# Patient Record
Sex: Female | Born: 1985 | Race: White | Hispanic: No | Marital: Married | Smoking: Never smoker
Health system: Southern US, Community
[De-identification: ages and names within clinical notes are randomized; demographics above are authoritative.]

## PROBLEM LIST (undated history)

## (undated) DIAGNOSIS — J302 Other seasonal allergic rhinitis: Secondary | ICD-10-CM

## (undated) HISTORY — DX: Other seasonal allergic rhinitis: J30.2

---

## 2004-08-07 ENCOUNTER — Other Ambulatory Visit: Admission: RE | Admit: 2004-08-07 | Discharge: 2004-08-07 | Payer: Self-pay | Admitting: Obstetrics and Gynecology

## 2004-08-08 ENCOUNTER — Other Ambulatory Visit: Admission: RE | Admit: 2004-08-08 | Discharge: 2004-08-08 | Payer: Self-pay | Admitting: Obstetrics and Gynecology

## 2005-01-24 ENCOUNTER — Other Ambulatory Visit: Admission: RE | Admit: 2005-01-24 | Discharge: 2005-01-24 | Payer: Self-pay | Admitting: Obstetrics and Gynecology

## 2005-07-17 ENCOUNTER — Other Ambulatory Visit: Admission: RE | Admit: 2005-07-17 | Discharge: 2005-07-17 | Payer: Self-pay | Admitting: Obstetrics and Gynecology

## 2006-05-18 ENCOUNTER — Emergency Department (HOSPITAL_COMMUNITY): Admission: EM | Admit: 2006-05-18 | Discharge: 2006-05-18 | Payer: Self-pay | Admitting: Emergency Medicine

## 2007-11-10 ENCOUNTER — Encounter: Admission: RE | Admit: 2007-11-10 | Discharge: 2007-11-10 | Payer: Self-pay | Admitting: Obstetrics and Gynecology

## 2013-05-27 NOTE — L&D Delivery Note (Signed)
Delivery Note At 8:26 AM a viable female was delivered via Vaginal, Spontaneous Delivery (Presentation: ; Occiput Anterior).  APGAR: 7, 9; weight . pending  Placenta status: Intact, Spontaneous.  Cord: 3 vessels with the following complications:nuchal cord x 2 None.  Cord pH: na  Anesthesia: Epidural  Episiotomy: none Lacerations: 3rd degree Suture Repair: 2.0 chromic Est. Blood Loss (mL): 350  Mom to postpartum.  Baby to Couplet care / Skin to Skin.  Ramonita Koenig S 01/17/2014, 8:38 AM

## 2013-07-06 ENCOUNTER — Other Ambulatory Visit: Payer: Self-pay

## 2013-07-28 ENCOUNTER — Other Ambulatory Visit (HOSPITAL_COMMUNITY): Payer: Self-pay | Admitting: Obstetrics and Gynecology

## 2013-07-28 DIAGNOSIS — O283 Abnormal ultrasonic finding on antenatal screening of mother: Secondary | ICD-10-CM

## 2013-07-28 DIAGNOSIS — Z3689 Encounter for other specified antenatal screening: Secondary | ICD-10-CM

## 2013-07-30 ENCOUNTER — Encounter (HOSPITAL_COMMUNITY): Payer: Self-pay

## 2013-07-30 ENCOUNTER — Ambulatory Visit (HOSPITAL_COMMUNITY)
Admission: RE | Admit: 2013-07-30 | Discharge: 2013-07-30 | Disposition: A | Discharge status: Home / Self Care | Payer: 59 | Source: Ambulatory Visit | Attending: Obstetrics and Gynecology | Admitting: Obstetrics and Gynecology

## 2013-07-30 DIAGNOSIS — Z363 Encounter for antenatal screening for malformations: Secondary | ICD-10-CM | POA: Insufficient documentation

## 2013-07-30 DIAGNOSIS — O358XX0 Maternal care for other (suspected) fetal abnormality and damage, not applicable or unspecified: Secondary | ICD-10-CM | POA: Insufficient documentation

## 2013-07-30 DIAGNOSIS — O283 Abnormal ultrasonic finding on antenatal screening of mother: Secondary | ICD-10-CM

## 2013-07-30 DIAGNOSIS — Z1389 Encounter for screening for other disorder: Secondary | ICD-10-CM | POA: Insufficient documentation

## 2013-07-30 DIAGNOSIS — Z3689 Encounter for other specified antenatal screening: Secondary | ICD-10-CM

## 2013-07-30 NOTE — Consult Note (Signed)
Maternal Fetal Medicine Consultation  Requesting Provider(s): Harold HedgeJames Tomblin II, MD  Reason for consultation: ? Mass in front of fetal neck  HPI: Penny Knox is a 28 yo G1P0 currently at 3648w0d who was seen for consultation due to possible fetal mass in front of the neck noted on early ultrasound.  Her prenatal course has otherwise been uncomplicated.  Her first trimester screen was low risk for aneuploidy.  She is without complaints today.  OB History: OB History   Grav Para Term Preterm Abortions TAB SAB Ect Mult Living   1 0 0 0 0 0 0 0 0 0       PMH: No past medical history on file.  PSH: No past surgical history on file.  Meds: Prenatal vitamins  Allergies: NKDA   FH: Denies family history of birth defects or hereditary disorders  Soc: denies Tobacco or ETOH use  PE:  161.5#, 122/76, 89  GEN: well-appearing female ABD: gravid, NT  Ultrasound: Single IUP at 16 0/7 weeks The previously noted structure in front of the fetal neck is not appreciated Echogenic bowel was noted that was as echogenic as adjacent bone The remainder of the fetal anatomy is within normal limits Limited views of the fetal heart were obtained due to early gestational age On some views, a small (7 mm x 1 cm) umbilical cord cyst is noted near the placental insertion site  A/P: 1) Single IUP at 16 0/7 weeks         2) Echogenic bowel - we reviewed the differential diagnosis for echogenic bowel to include aneuploidy (had a normal first trimester screen), cystic fibrosis, fetal viral infections (TORCH) as well as swallowed blood and artifact.  The patient previously had Toxoplasmosis serologies performed that were negative for acute infection.  Her CF carrier screen was negative in 2011.  After counseling, the patient decided that she would prefer to wait until her scheduled ultrasound in 3 weeks - if echogenic bowel is again appreciated at that time, she would entertain additional workup then.         3) ?  Small umbilical cord cyst at placental cord insertion site - my suspicion is that this is likely benign.  There are reports of increased risks of both aneuploidy and complications with umbilical cord cysts.  If this finding is again appreciated on follow up, would entertain additional work up at that time.  Recommendations: 1) The patient would prefer to wait until her scheduled ultrasound in her clinic before pursuing a work up.  If echogenic bowel is again noted, would offer NIPS (cell free fetal DNA), viral serologies (CMV and Parvo IgM, IgG).  If the umbilical cord cyst is again noted, this would also be an indication for to check cell free fetal DNA. 2) Please contact our clinic if you would prefer that her follow up ultrasound be performed in this office.   Thank you for the opportunity to be a part of the care of Penny Knox. Please contact our office if we can be of further assistance.   I spent approximately 15 minutes with this patient with over 50% of time spent in face-to-face counseling.  Alpha GulaPaul Dam Ashraf, MD Maternal Fetal Medicine

## 2013-12-15 LAB — OB RESULTS CONSOLE GBS: GBS: NEGATIVE

## 2014-01-17 ENCOUNTER — Inpatient Hospital Stay (HOSPITAL_COMMUNITY)
Admission: AD | Admit: 2014-01-17 | Discharge: 2014-01-18 | DRG: 775 | Disposition: A | Discharge status: Home / Self Care | Payer: 59 | Source: Ambulatory Visit | Attending: Obstetrics and Gynecology | Admitting: Obstetrics and Gynecology

## 2014-01-17 ENCOUNTER — Encounter (HOSPITAL_COMMUNITY): Payer: Self-pay

## 2014-01-17 ENCOUNTER — Encounter (HOSPITAL_COMMUNITY): Payer: 59

## 2014-01-17 ENCOUNTER — Encounter (HOSPITAL_COMMUNITY): Payer: 59 | Admitting: Anesthesiology

## 2014-01-17 ENCOUNTER — Inpatient Hospital Stay (HOSPITAL_COMMUNITY): Payer: 59 | Admitting: Anesthesiology

## 2014-01-17 DIAGNOSIS — O99891 Other specified diseases and conditions complicating pregnancy: Secondary | ICD-10-CM | POA: Diagnosis present

## 2014-01-17 DIAGNOSIS — Z3490 Encounter for supervision of normal pregnancy, unspecified, unspecified trimester: Secondary | ICD-10-CM

## 2014-01-17 LAB — TYPE AND SCREEN
ABO/RH(D): A POS
ANTIBODY SCREEN: NEGATIVE

## 2014-01-17 LAB — CBC
HEMATOCRIT: 39.5 % (ref 36.0–46.0)
Hemoglobin: 13.8 g/dL (ref 12.0–15.0)
MCH: 30.5 pg (ref 26.0–34.0)
MCHC: 34.9 g/dL (ref 30.0–36.0)
MCV: 87.4 fL (ref 78.0–100.0)
Platelets: 240 10*3/uL (ref 150–400)
RBC: 4.52 MIL/uL (ref 3.87–5.11)
RDW: 13.2 % (ref 11.5–15.5)
WBC: 15.1 10*3/uL — AB (ref 4.0–10.5)

## 2014-01-17 LAB — RPR

## 2014-01-17 LAB — POCT FERN TEST: POCT FERN TEST: POSITIVE

## 2014-01-17 LAB — ABO/RH: ABO/RH(D): A POS

## 2014-01-17 MED ORDER — IBUPROFEN 600 MG PO TABS
600.0000 mg | ORAL_TABLET | Freq: Four times a day (QID) | ORAL | Status: DC
  Administered 2014-01-17 – 2014-01-18 (×5): 600 mg via ORAL
  Filled 2014-01-17 (×5): qty 1

## 2014-01-17 MED ORDER — DIPHENHYDRAMINE HCL 50 MG/ML IJ SOLN: 12.5000 mg | INTRAMUSCULAR | Status: DC | PRN

## 2014-01-17 MED ORDER — PHENYLEPHRINE 40 MCG/ML (10ML) SYRINGE FOR IV PUSH (FOR BLOOD PRESSURE SUPPORT)
80.0000 ug | PREFILLED_SYRINGE | INTRAVENOUS | Status: DC | PRN
  Filled 2014-01-17: qty 2

## 2014-01-17 MED ORDER — SIMETHICONE 80 MG PO CHEW: 80.0000 mg | CHEWABLE_TABLET | ORAL | Status: DC | PRN

## 2014-01-17 MED ORDER — TETANUS-DIPHTH-ACELL PERTUSSIS 5-2.5-18.5 LF-MCG/0.5 IM SUSP: 0.5000 mL | Freq: Once | INTRAMUSCULAR | Status: DC

## 2014-01-17 MED ORDER — IBUPROFEN 600 MG PO TABS: 600.0000 mg | ORAL_TABLET | Freq: Four times a day (QID) | ORAL | Status: DC | PRN

## 2014-01-17 MED ORDER — DIPHENHYDRAMINE HCL 25 MG PO CAPS: 25.0000 mg | ORAL_CAPSULE | Freq: Four times a day (QID) | ORAL | Status: DC | PRN

## 2014-01-17 MED ORDER — SENNOSIDES-DOCUSATE SODIUM 8.6-50 MG PO TABS
2.0000 | ORAL_TABLET | ORAL | Status: DC
  Administered 2014-01-18: 2 via ORAL
  Filled 2014-01-17: qty 2

## 2014-01-17 MED ORDER — OXYTOCIN BOLUS FROM INFUSION
500.0000 mL | INTRAVENOUS | Status: DC
  Administered 2014-01-17: 500 mL via INTRAVENOUS

## 2014-01-17 MED ORDER — WITCH HAZEL-GLYCERIN EX PADS: 1.0000 "application " | MEDICATED_PAD | CUTANEOUS | Status: DC | PRN

## 2014-01-17 MED ORDER — LACTATED RINGERS IV SOLN: 500.0000 mL | INTRAVENOUS | Status: DC | PRN

## 2014-01-17 MED ORDER — BENZOCAINE-MENTHOL 20-0.5 % EX AERO
1.0000 "application " | INHALATION_SPRAY | CUTANEOUS | Status: DC | PRN
  Filled 2014-01-17 (×2): qty 56

## 2014-01-17 MED ORDER — FLEET ENEMA 7-19 GM/118ML RE ENEM: 1.0000 | ENEMA | Freq: Every day | RECTAL | Status: DC | PRN

## 2014-01-17 MED ORDER — ACETAMINOPHEN 325 MG PO TABS: 650.0000 mg | ORAL_TABLET | ORAL | Status: DC | PRN

## 2014-01-17 MED ORDER — LACTATED RINGERS IV SOLN
500.0000 mL | Freq: Once | INTRAVENOUS | Status: AC
  Administered 2014-01-17: 500 mL via INTRAVENOUS

## 2014-01-17 MED ORDER — OXYTOCIN 40 UNITS IN LACTATED RINGERS INFUSION - SIMPLE MED
62.5000 mL/h | INTRAVENOUS | Status: DC
  Filled 2014-01-17: qty 1000

## 2014-01-17 MED ORDER — OXYCODONE-ACETAMINOPHEN 5-325 MG PO TABS: 1.0000 | ORAL_TABLET | ORAL | Status: DC | PRN

## 2014-01-17 MED ORDER — PRENATAL MULTIVITAMIN CH
1.0000 | ORAL_TABLET | Freq: Every day | ORAL | Status: DC
  Administered 2014-01-17 – 2014-01-18 (×2): 1 via ORAL
  Filled 2014-01-17 (×2): qty 1

## 2014-01-17 MED ORDER — LIDOCAINE HCL (PF) 1 % IJ SOLN
30.0000 mL | INTRAMUSCULAR | Status: DC | PRN
  Filled 2014-01-17: qty 30

## 2014-01-17 MED ORDER — ONDANSETRON HCL 4 MG PO TABS: 4.0000 mg | ORAL_TABLET | ORAL | Status: DC | PRN

## 2014-01-17 MED ORDER — FENTANYL 2.5 MCG/ML BUPIVACAINE 1/10 % EPIDURAL INFUSION (WH - ANES)
14.0000 mL/h | INTRAMUSCULAR | Status: DC | PRN
  Administered 2014-01-17: 14 mL/h via EPIDURAL
  Filled 2014-01-17: qty 125

## 2014-01-17 MED ORDER — CITRIC ACID-SODIUM CITRATE 334-500 MG/5ML PO SOLN: 30.0000 mL | ORAL | Status: DC | PRN

## 2014-01-17 MED ORDER — LANOLIN HYDROUS EX OINT: TOPICAL_OINTMENT | CUTANEOUS | Status: DC | PRN

## 2014-01-17 MED ORDER — LACTATED RINGERS IV SOLN
INTRAVENOUS | Status: DC
  Administered 2014-01-17: 04:00:00 via INTRAVENOUS
  Administered 2014-01-17: 125 mL/h via INTRAVENOUS

## 2014-01-17 MED ORDER — PHENYLEPHRINE 40 MCG/ML (10ML) SYRINGE FOR IV PUSH (FOR BLOOD PRESSURE SUPPORT)
80.0000 ug | PREFILLED_SYRINGE | INTRAVENOUS | Status: DC | PRN
  Filled 2014-01-17: qty 10
  Filled 2014-01-17: qty 2

## 2014-01-17 MED ORDER — EPHEDRINE 5 MG/ML INJ
10.0000 mg | INTRAVENOUS | Status: DC | PRN
  Filled 2014-01-17: qty 2

## 2014-01-17 MED ORDER — LIDOCAINE HCL (PF) 1 % IJ SOLN
INTRAMUSCULAR | Status: DC | PRN
  Administered 2014-01-17 (×2): 8 mL

## 2014-01-17 MED ORDER — ONDANSETRON HCL 4 MG/2ML IJ SOLN: 4.0000 mg | Freq: Four times a day (QID) | INTRAMUSCULAR | Status: DC | PRN

## 2014-01-17 MED ORDER — FLEET ENEMA 7-19 GM/118ML RE ENEM: 1.0000 | ENEMA | RECTAL | Status: DC | PRN

## 2014-01-17 MED ORDER — FENTANYL 2.5 MCG/ML BUPIVACAINE 1/10 % EPIDURAL INFUSION (WH - ANES)
INTRAMUSCULAR | Status: DC | PRN
  Administered 2014-01-17: 14 mL/h via EPIDURAL

## 2014-01-17 MED ORDER — BISACODYL 10 MG RE SUPP: 10.0000 mg | Freq: Every day | RECTAL | Status: DC | PRN

## 2014-01-17 MED ORDER — DIBUCAINE 1 % RE OINT: 1.0000 "application " | TOPICAL_OINTMENT | RECTAL | Status: DC | PRN

## 2014-01-17 MED ORDER — ONDANSETRON HCL 4 MG/2ML IJ SOLN: 4.0000 mg | INTRAMUSCULAR | Status: DC | PRN

## 2014-01-17 MED ORDER — ZOLPIDEM TARTRATE 5 MG PO TABS: 5.0000 mg | ORAL_TABLET | Freq: Every evening | ORAL | Status: DC | PRN

## 2014-01-17 MED ORDER — BUTORPHANOL TARTRATE 1 MG/ML IJ SOLN
1.0000 mg | INTRAMUSCULAR | Status: DC | PRN
  Administered 2014-01-17: 1 mg via INTRAVENOUS
  Filled 2014-01-17: qty 1

## 2014-01-17 NOTE — MAU Note (Signed)
Leaking clear fluid since 1105 pm tonight. Contractions every 5 minutes. Cervix was closed on Thursday. Denies vaginal bleeding. Positive fetal movement.

## 2014-01-17 NOTE — H&P (Signed)
Penny Knox is a 28 y.o. female presenting at 41.3 with SROM.  PNC complicated by echogenic bowel and cyst of umbilical cord.  Genetic studies neg.  Neg for CMV or Parvovirus.   Maternal Medical History:  Reason for admission: Rupture of membranes.   Contractions: Onset was 1-2 hours ago.   Frequency: regular.   Perceived severity is moderate.    Fetal activity: Perceived fetal activity is normal.    Prenatal complications: Echogenic bowel resolved on f/u   No evidence of recent cmv or parvovirus.   Cyst of umbilical cord near placenta.  genrtics negatve  Prenatal Complications - Diabetes: none.    OB History   Grav Para Term Preterm Abortions TAB SAB Ect Mult Living       History reviewed. No pertinent past medical history. History reviewed. No pertinent past surgical history. Family History: family history is not on file. Social History:  reports that she has never smoked. She has never used smokeless tobacco. She reports that she drinks alcohol. She reports that she does not use illicit drugs.   Prenatal Transfer Tool  Maternal Diabetes: No Genetic Screening: Normal Maternal Ultrasounds/Referrals: Abnormal:  Findings:   Other:echogenic bowel and cyst of umbilical cord Fetal Ultrasounds or other Referrals:  None Maternal Substance Abuse:  No Significant Maternal Medications:  None Significant Maternal Lab Results:  Lab values include: Group B Strep negative Other Comments:  None  ROS  Dilation: 10 Effacement (%): 100 Station: +2 Exam by:: Dr Reola Mosher Blood pressure 124/67, pulse 69, temperature 98.1 F (36.7 C), temperature source Oral, resp. rate 20, height  (1.727 m), weight 82.555 kg (182 lb), last menstrual period 04/09/2013, SpO2 100.00%. Maternal Exam:  Uterine Assessment: Contraction strength is moderate.  Contraction frequency is regular.   Abdomen: Patient reports no abdominal tenderness. Fundal height is c/w dates.   Estimated fetal  weight is 8 lbs.   Fetal presentation: vertex  Introitus: Amniotic fluid character: clear.  Pelvis: adequate for delivery.   Cervix: Complete dilation vtx at 0  Fetal Exam Fetal State Assessment: Category I - tracings are normal.     Physical Exam  Prenatal labs: ABO, Rh: --/--/A POS, A POS (08/24 0345) Antibody: NEG (08/24 0345) Rubella:   RPR:    HBsAg:    HIV:    GBS: Negative (07/22 0000)   Assessment/Plan: IUP at term with Srom Previous echogenic bowel Umbilical cord cyst Plan vag del  Royalty Fakhouri S 01/17/2014, 7:39 AM

## 2014-01-17 NOTE — Anesthesia Postprocedure Evaluation (Signed)
  Anesthesia Post-op Note  Patient: Penny Knox  Procedure(s) Performed: * No procedures listed *  Patient Location: Mother/Baby  Anesthesia Type:Epidural  Level of Consciousness: awake, alert , oriented and patient cooperative  Airway and Oxygen Therapy: Patient Spontanous Breathing  Post-op Pain: mild  Post-op Assessment: Patient's Cardiovascular Status Stable, Respiratory Function Stable, No headache, No backache, No residual numbness and No residual motor weakness  Post-op Vital Signs: stable  Last Vitals:  Filed Vitals:   01/17/14 1208  BP: 143/81  Pulse: 66  Temp: 36.4 C  Resp: 18    Complications: No apparent anesthesia complications

## 2014-01-17 NOTE — Anesthesia Procedure Notes (Signed)
Epidural Patient location during procedure: OB Start time: 01/17/2014 6:19 AM End time: 01/17/2014 6:23 AM  Staffing Anesthesiologist: Leilani Able Performed by: anesthesiologist   Preanesthetic Checklist Completed: patient identified, surgical consent, pre-op evaluation, timeout performed, IV checked, risks and benefits discussed and monitors and equipment checked  Epidural Patient position: sitting Prep: site prepped and draped and DuraPrep Patient monitoring: continuous pulse ox and blood pressure Approach: midline Location: L3-L4 Injection technique: LOR air  Needle:  Needle type: Tuohy  Needle gauge: 17 G Needle length: 9 cm and 9 Needle insertion depth: 5 cm cm Catheter type: closed end flexible Catheter size: 19 Gauge Catheter at skin depth: 10 cm Test dose: negative and Other  Assessment Sensory level: T9 Events: blood not aspirated, injection not painful, no injection resistance, negative IV test and no paresthesia  Additional Notes Reason for block:procedure for pain

## 2014-01-17 NOTE — Lactation Note (Signed)
This note was copied from the chart of Penny Knox. Lactation Consultation Note  Patient Name: Penny Janeka Libman ZOXWR'U Date: 01/17/2014 Reason for consult: Initial assessment of this mom and baby at 13 hours postpartum.  Mom is almost asleep but responds to [facility] and states that her newborn has been latching well consistently since birth.  Baby has had 5 feedings and 2 stools thus far and mom denies any breastfeeding concerns.  LC limited visit due to FOB asleep and baby also asleep in open crib on his back, not showing any feeding cues.  LC encouraged cue feedings and frequent STS.  LC encouraged review of Baby and Me pp 9, 14 and 20-25 for STS and BF information. LC provided [employer] brochure and reviewed [facility] services and list of community and web site resources.    Maternal Data Formula Feeding for Exclusion: No Has patient been taught Hand Expression?:  (not yet documented and mom almost asleep at time of LC visit) Does the patient have breastfeeding experience prior to this delivery?: No  Feeding Feeding Type: Breast Fed  LATCH Score/Interventions           no LATCH score yet assessed but baby has had 5 feedings of 10-30 minutes each           Lactation Tools Discussed/Used   STS and cue feedings  Consult Status Consult Status: Follow-up Date: 01/18/14 Follow-up type: In-patient    Warrick Parisian [facility] 01/17/2014, 10:04 PM

## 2014-01-17 NOTE — Anesthesia Preprocedure Evaluation (Signed)

## 2014-01-18 LAB — COMPREHENSIVE METABOLIC PANEL WITH GFR
ALT: 23 U/L (ref 0–35)
AST: 32 U/L (ref 0–37)
Albumin: 2.3 g/dL — ABNORMAL LOW (ref 3.5–5.2)
Alkaline Phosphatase: 184 U/L — ABNORMAL HIGH (ref 39–117)
Anion gap: 9 (ref 5–15)
BUN: 12 mg/dL (ref 6–23)
CO2: 25 meq/L (ref 19–32)
Calcium: 7.9 mg/dL — ABNORMAL LOW (ref 8.4–10.5)
Chloride: 105 meq/L (ref 96–112)
Creatinine, Ser: 1.06 mg/dL (ref 0.50–1.10)
GFR calc Af Amer: 81 mL/min — ABNORMAL LOW (ref >90)
GFR calc non Af Amer: 70 mL/min — ABNORMAL LOW (ref >90)
Glucose, Bld: 66 mg/dL — ABNORMAL LOW (ref 70–99)
Potassium: 4.4 meq/L (ref 3.7–5.3)
Sodium: 139 meq/L (ref 137–147)
Total Bilirubin: <0.2 mg/dL — ABNORMAL LOW (ref 0.3–1.2)
Total Protein: 4.8 g/dL — ABNORMAL LOW (ref 6.0–8.3)

## 2014-01-18 LAB — CBC
HCT: 38.3 % (ref 36.0–46.0)
Hemoglobin: 13 g/dL (ref 12.0–15.0)
MCH: 29.9 pg (ref 26.0–34.0)
MCHC: 33.9 g/dL (ref 30.0–36.0)
MCV: 88 fL (ref 78.0–100.0)
PLATELETS: 209 10*3/uL (ref 150–400)
RBC: 4.35 MIL/uL (ref 3.87–5.11)
RDW: 13.4 % (ref 11.5–15.5)
WBC: 16.6 10*3/uL — ABNORMAL HIGH (ref 4.0–10.5)

## 2014-01-18 MED ORDER — IBUPROFEN 600 MG PO TABS: 600.0000 mg | ORAL_TABLET | Freq: Four times a day (QID) | ORAL | Status: DC

## 2014-01-18 MED ORDER — PRENATAL MULTIVITAMIN CH: 1.0000 | ORAL_TABLET | Freq: Every day | ORAL | Status: DC

## 2014-01-18 NOTE — Lactation Note (Signed)
This note was copied from the chart of Penny Knox. Lactation Consultation Note  Mother able to hand express good drops of colostrum. Baby had a tight frenulum at tip of tongue.  Recommend family discuss it with Peds. Babies stools transitioning. Mother placed baby in cross cradle hold, sucks and swallows observed. Reviewed engorgement care and monitoring voids/stools. Will see Peds at [facility] tomorrow. Provided mother with comfort gels and reviewed use.  Patient Name: Penny Knox ZOXWR'U Date: 01/18/2014 Reason for consult: Follow-up assessment   Maternal Data    Feeding Feeding Type: Breast Fed  LATCH Score/Interventions Latch: Grasps breast easily, tongue down, lips flanged, rhythmical sucking.  Audible Swallowing: A few with stimulation  Type of Nipple: Everted at rest and after stimulation  Comfort (Breast/Nipple): Soft / non-tender     Hold (Positioning): No assistance needed to correctly position infant at breast. Intervention(s): Breastfeeding basics reviewed  LATCH Score: 9  Lactation Tools Discussed/Used     Consult Status Consult Status: Follow-up Date: 01/19/14 Follow-up type: In-patient    Dahlia Byes [facility] 01/18/2014, 1:51 PM

## 2014-01-18 NOTE — Progress Notes (Signed)
Post Partum Day 1 Subjective: no complaints, up ad lib, voiding, tolerating PO, + flatus and desires early discharge . Denies HA, blurred vision  Objective: Blood pressure 128/90, pulse 82, temperature 98.1 F (36.7 C), temperature source Oral, resp. rate 18, height  (1.727 m), weight 182 lb (82.555 kg), last menstrual period 04/09/2013, SpO2 98.00%, unknown if currently breastfeeding.  Physical Exam:  General: alert and cooperative Lochia: appropriate Uterine Fundus: firm Incision: perineum intact DVT Evaluation: No evidence of DVT seen on physical exam. Negative Homan's sign. No cords or calf tenderness. No significant calf/ankle edema. DTR's 2+ , no clonus   Recent Labs  01/17/14 0345 01/18/14 0651  HGB 13.8 13.0  HCT 39.5 38.3    Assessment/Plan: Discharge home and Circumcision prior to discharge CMP ordered. Discussed signs and symptoms of PIH.   LOS: 1 day   Tenae Graziosi G 01/18/2014, 7:52 AM

## 2014-01-18 NOTE — Discharge Summary (Signed)
Obstetric Discharge Summary Reason for Admission: rupture of membranes Prenatal Procedures: ultrasound Intrapartum Procedures: spontaneous vaginal delivery Postpartum Procedures: none Complications-Operative and Postpartum: 2 degree perineal laceration Hemoglobin  Date Value Ref Range Status  01/18/2014 13.0  12.0 - 15.0 g/dL Final     HCT  Date Value Ref Range Status  01/18/2014 38.3  36.0 - 46.0 % Final    Physical Exam:  General: alert and cooperative Lochia: appropriate Uterine Fundus: firm Incision: perineum intact DVT Evaluation: No evidence of DVT seen on physical exam. Negative Homan's sign. No cords or calf tenderness. No significant calf/ankle edema. Calf/Ankle edema is present. DTR's 2+  Discharge Diagnoses: Term Pregnancy-delivered  Discharge Information: Date: 01/18/2014 Activity: pelvic rest Diet: routine Medications: PNV and Ibuprofen Condition: stable Instructions: refer to practice specific booklet Discharge to: home   Newborn Data: Live born female  Birth Weight: 6 lb 13.5 oz (3104 g) APGAR: 7, 9  Home with mother.  Aralyn Nowak G 01/18/2014, 7:55 AM

## 2014-01-20 ENCOUNTER — Inpatient Hospital Stay (HOSPITAL_COMMUNITY): Admission: RE | Admit: 2014-01-20 | Payer: 59 | Source: Ambulatory Visit

## 2014-03-28 ENCOUNTER — Encounter (HOSPITAL_COMMUNITY): Payer: Self-pay

## 2015-02-04 IMAGING — US US OB DETAIL+14 WK
1 series · 12 of 28 positions shown · non-contrast
Comparison: none

[Series 1: us ob detail+14 wk · 0.17mm/px · 12 of 83 slices shown]
[im 4/83]
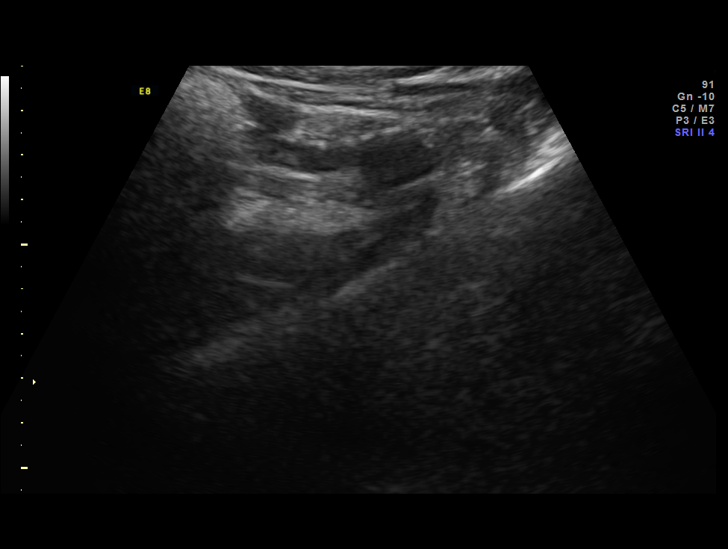
[im 10/83]
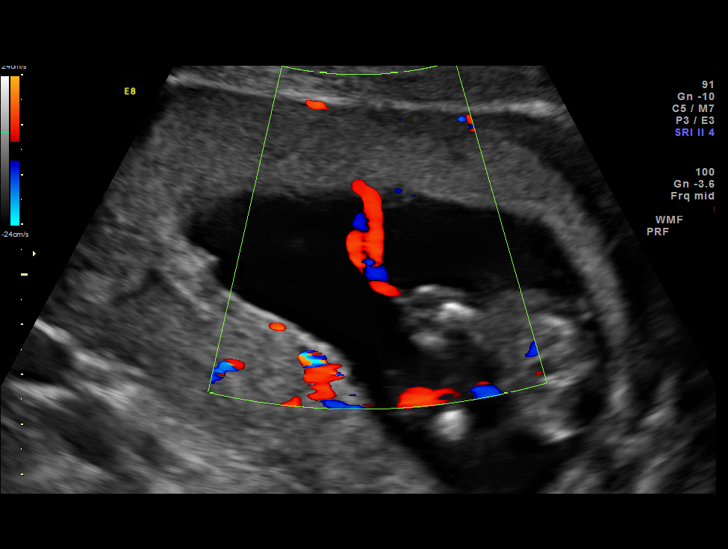
[im 16/83]
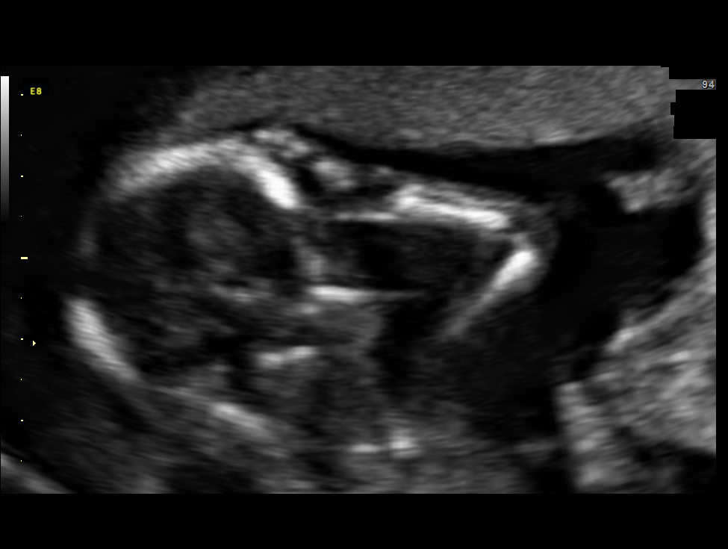
[im 25/83]
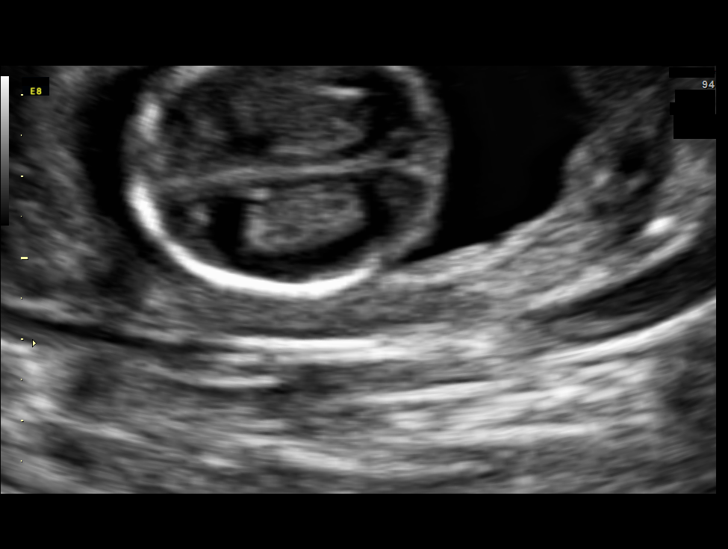
[im 31/83]
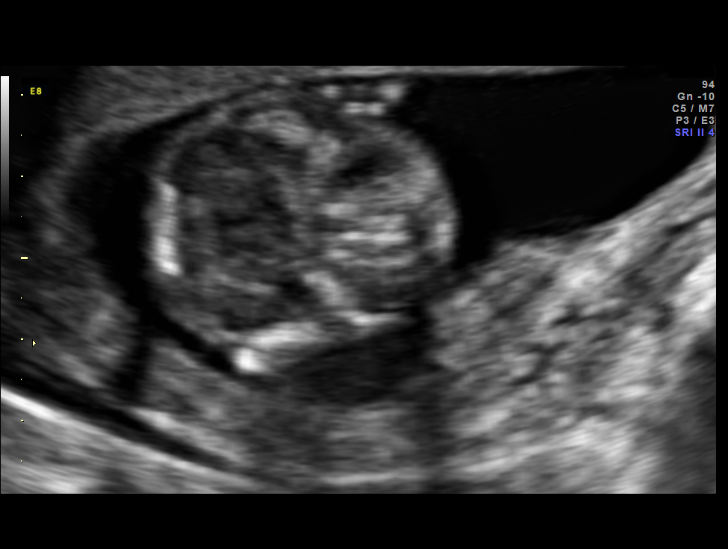
[im 37/83]
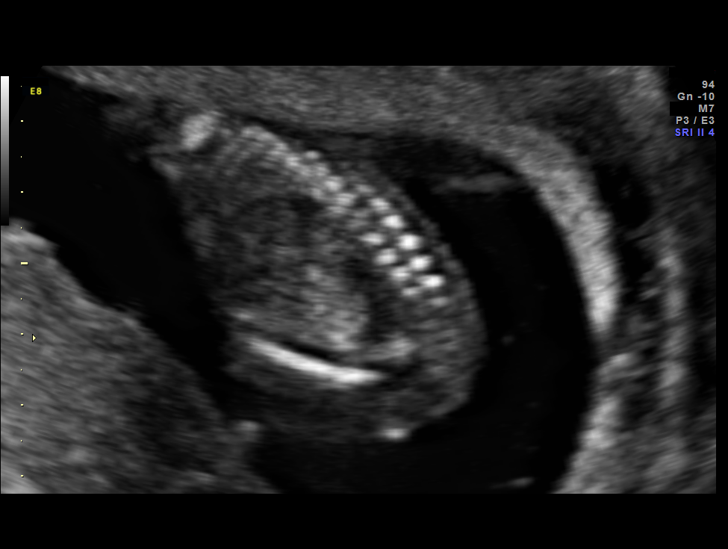
[im 46/83]
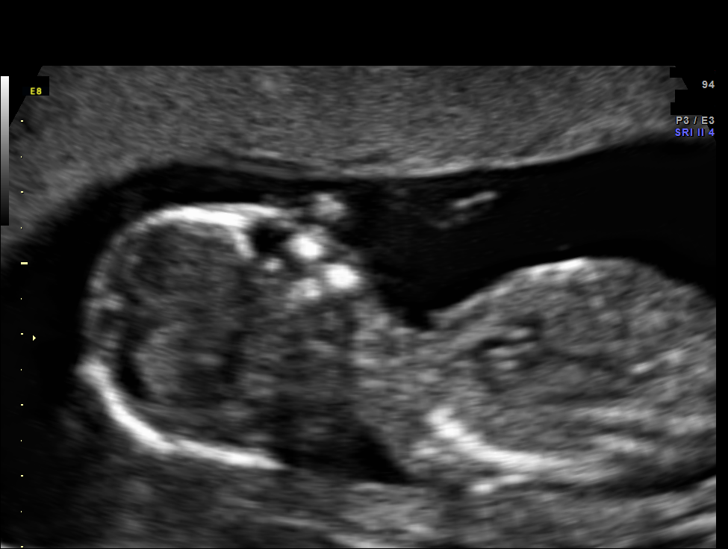
[im 52/83]
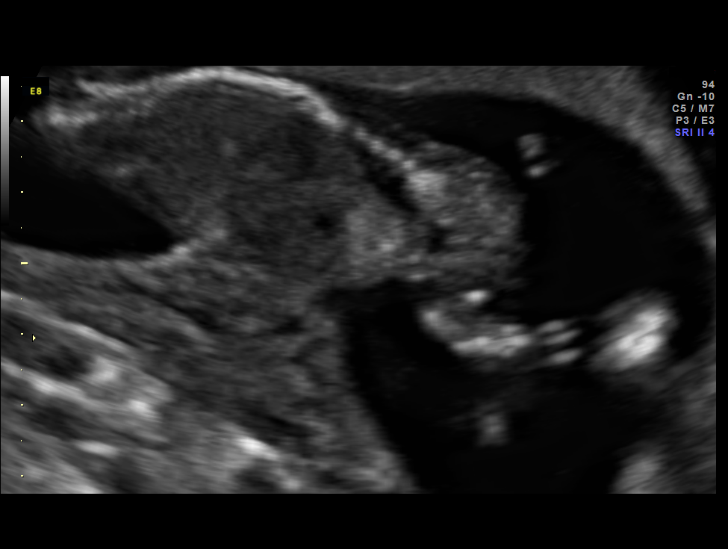
[im 58/83]
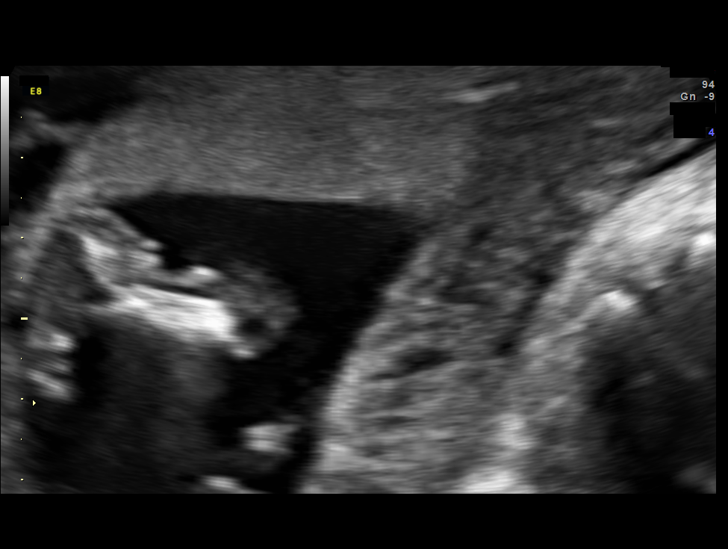
[im 67/83]
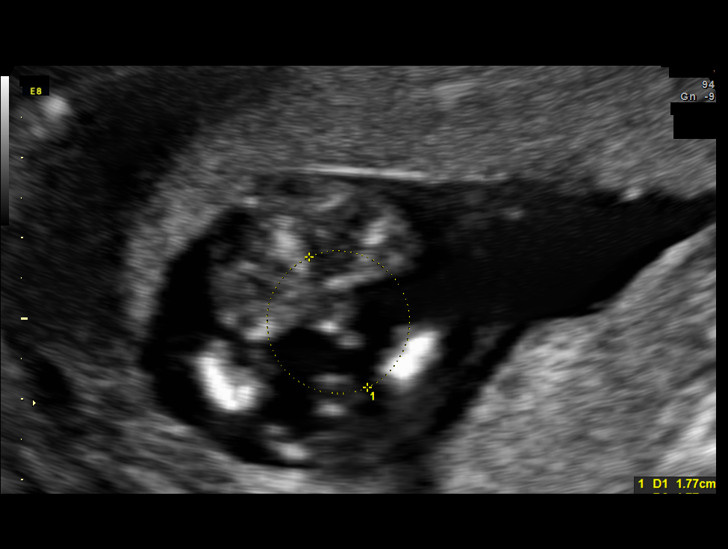
[im 73/83]
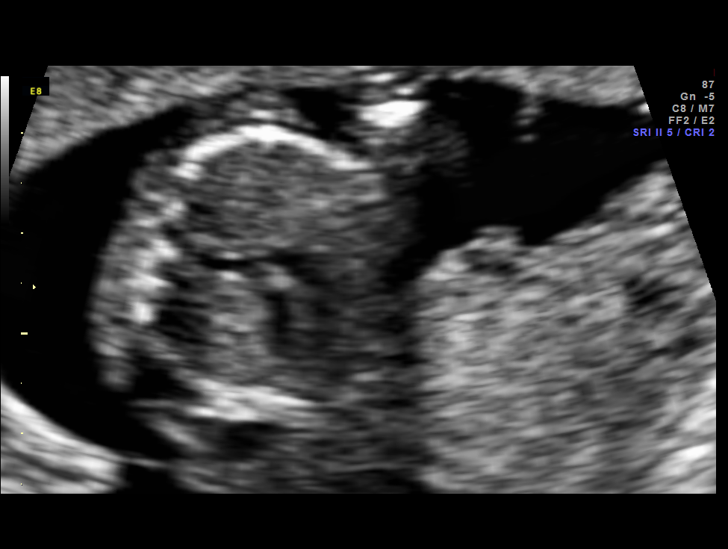
[im 79/83]
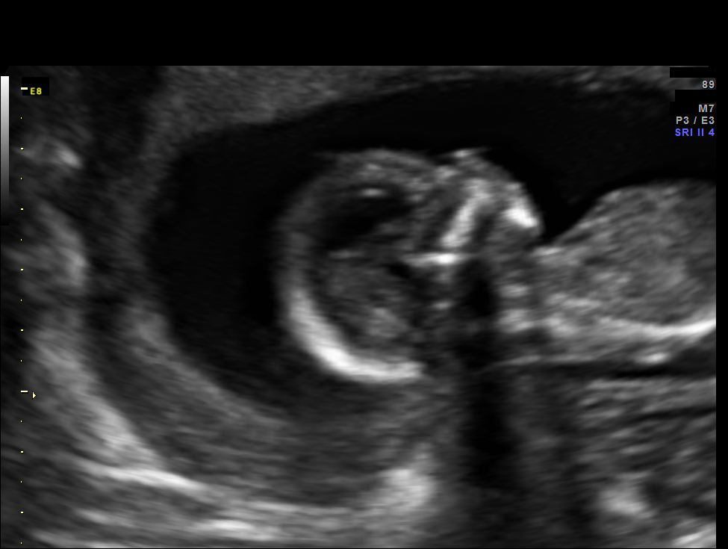

[12 of 28 positions shown; findings below may reference images not displayed]

OBSTETRICS REPORT
                      (Signed Final 07/30/2013 [DATE])

Service(s) Provided

 US OB DETAIL + 14 WK                                  76811.0
Indications

 Detailed fetal anatomic survey
 Fetal abnormality - other known or suspected
Fetal Evaluation

 Num Of Fetuses:    1
 Fetal Heart Rate:  153                          bpm
 Cardiac Activity:  Observed
 Presentation:      Breech
 Placenta:          Anterior, above cervical os
 P. Cord            Visualized
 Insertion:

 Amniotic Fluid
 AFI FV:      Subjectively within normal limits
                                             Larg Pckt:     4.9  cm
Biometry

 BPD:     31.9  mm     G. Age:  16w 0d                CI:         74.4   70 - 86
 OFD:     42.9  mm                                    FL/HC:      15.7   13.3 -

 HC:     120.7  mm     G. Age:  16w 0d       36  %    HC/AC:      1.19   1.05 -

 AC:     101.3  mm     G. Age:  16w 1d       56  %    FL/BPD:
 FL:        19  mm     G. Age:  15w 4d       31  %    FL/AC:      18.8   20 - 24
 HUM:     18.8  mm     G. Age:  15w 4d       37  %
 CER:     15.1  mm     G. Age:  15w 1d       30  %
 NFT:      1.7  mm
 Est. FW:     139  gm      0 lb 5 oz     66  %
Gestational Age

 LMP:           16w 0d        Date:  04/09/13                 EDD:   01/14/14
 U/S Today:     15w 6d                                        EDD:   01/15/14
 Best:          16w 0d     Det. By:  LMP  (04/09/13)          EDD:   01/14/14
Anatomy
 Cranium:          Appears normal         Aortic Arch:      Not well visualized
 Fetal Cavum:      Appears normal         Ductal Arch:      Not well visualized
 Ventricles:       Appears normal         Diaphragm:        Appears normal
 Choroid Plexus:   Appears normal         Stomach:          Appears normal, left
                                                            sided
 Cerebellum:       Appears normal         Abdomen:          Echogenic Bowel
 Posterior Fossa:  Appears normal         Abdominal Wall:   Appears nml (cord
                                                            insert, abd wall)
 Nuchal Fold:      Appears normal         Cord Vessels:     Appears normal (3
                                                            vessel cord)
 Face:             Orbits appear          Kidneys:          Appear normal
                   normal
 Lips:             Appears normal         Bladder:          Appears normal
 Heart:            Not well visualized    Spine:            Appears normal
 RVOT:             Not well visualized    Lower             Appears normal
                                          Extremities:
 LVOT:             Not well visualized    Upper             Appears normal
                                          Extremities:

 Other:  Fetus appears to be a male. Heels and 5th digit appear normal.
Targeted Anatomy

 Fetal Central Nervous System
 Cisterna Magna:
Cervix Uterus Adnexa

 Cervix:       Normal appearance by transabdominal scan. Appears
               closed, without funnelling.
 Left Ovary:    Not visualized. No adnexal mass visualized.
 Right Ovary:   Within normal limits.
Impression

 Single IUP at 16 0/7 weeks
 The previously noted structure in front of the fetal neck is not
 appreciated
 Echogenic bowel was noted that was as echogenic as
 adjacent bone
 The remainder of the fetal anatomy is within normal limits
 Limited views of the fetal heart were obtained due to early
 gestational age
 On some views, a small (7 mm x 1 cm) umbilical cord cyst is
 noted near the placental insertion site- likely of no clinical
 significance

 The findings and limitations for the study were discussed with
 the patient.  The patient elected to await further work up until
 after her scheduled ultrasound in 3 weeks.
Recommendations

 See separate consult letter.
 If echogenic bowel is persistent, would offer further work up
 to include NIPS (cell free fetal DNA) and viral serologies
 (CMV and Parvo IgG and IgM)
 Please contact our office if you would prefer that her follow up
 ultrasound be scheduled here.
 questions or concerns.

## 2015-04-18 LAB — OB RESULTS CONSOLE RUBELLA ANTIBODY, IGM: Rubella: IMMUNE

## 2015-04-18 LAB — OB RESULTS CONSOLE HIV ANTIBODY (ROUTINE TESTING): HIV: NONREACTIVE

## 2015-04-18 LAB — OB RESULTS CONSOLE GC/CHLAMYDIA
Chlamydia: NEGATIVE
Gonorrhea: NEGATIVE

## 2015-04-18 LAB — OB RESULTS CONSOLE ABO/RH: RH TYPE: POSITIVE

## 2015-04-18 LAB — OB RESULTS CONSOLE ANTIBODY SCREEN: Antibody Screen: NEGATIVE

## 2015-04-18 LAB — OB RESULTS CONSOLE HEPATITIS B SURFACE ANTIGEN: HEP B S AG: NEGATIVE

## 2015-04-18 LAB — OB RESULTS CONSOLE RPR: RPR: NONREACTIVE

## 2015-05-28 NOTE — L&D Delivery Note (Signed)
SVD of VMI at 0230 on 11/23/15.  EBL 150cc.  Placenta to L&D.  APGARs 8,10. Head delivered LOA and body followed atraumatically.  Baby to abdomen.  Cord was clamped and cut.  Cord blood was obtained.  Placenta delivered S/I/3VC.  Fundus was firmed with pitocin and massage.  2nd degree and right perineal lacs were repaired with 3-0 Rapide in the normal fashion. Mom and baby stable.    Mitchel HonourMegan Earline Stiner, DO

## 2015-11-22 ENCOUNTER — Inpatient Hospital Stay (HOSPITAL_COMMUNITY): Payer: 59 | Admitting: Anesthesiology

## 2015-11-22 ENCOUNTER — Encounter (HOSPITAL_COMMUNITY): Payer: Self-pay | Admitting: General Practice

## 2015-11-22 ENCOUNTER — Telehealth (HOSPITAL_COMMUNITY): Payer: Self-pay | Admitting: General Practice

## 2015-11-22 ENCOUNTER — Inpatient Hospital Stay (HOSPITAL_COMMUNITY)
Admission: AD | Admit: 2015-11-22 | Discharge: 2015-11-24 | DRG: 775 | Disposition: A | Discharge status: Home / Self Care | Payer: 59 | Source: Ambulatory Visit | Attending: Obstetrics & Gynecology | Admitting: Obstetrics & Gynecology

## 2015-11-22 ENCOUNTER — Encounter (HOSPITAL_COMMUNITY): Payer: Self-pay

## 2015-11-22 DIAGNOSIS — Z833 Family history of diabetes mellitus: Secondary | ICD-10-CM

## 2015-11-22 DIAGNOSIS — Z8249 Family history of ischemic heart disease and other diseases of the circulatory system: Secondary | ICD-10-CM | POA: Diagnosis not present

## 2015-11-22 DIAGNOSIS — Z3A40 40 weeks gestation of pregnancy: Secondary | ICD-10-CM | POA: Diagnosis not present

## 2015-11-22 DIAGNOSIS — Z818 Family history of other mental and behavioral disorders: Secondary | ICD-10-CM

## 2015-11-22 DIAGNOSIS — Z3490 Encounter for supervision of normal pregnancy, unspecified, unspecified trimester: Secondary | ICD-10-CM

## 2015-11-22 LAB — CBC
HCT: 38.2 % (ref 36.0–46.0)
Hemoglobin: 13.2 g/dL (ref 12.0–15.0)
MCH: 29.3 pg (ref 26.0–34.0)
MCHC: 34.6 g/dL (ref 30.0–36.0)
MCV: 84.9 fL (ref 78.0–100.0)
Platelets: 223 K/uL (ref 150–400)
RBC: 4.5 MIL/uL (ref 3.87–5.11)
RDW: 13.6 % (ref 11.5–15.5)
WBC: 14.9 K/uL — ABNORMAL HIGH (ref 4.0–10.5)

## 2015-11-22 LAB — TYPE AND SCREEN
ABO/RH(D): A POS
Antibody Screen: NEGATIVE

## 2015-11-22 LAB — OB RESULTS CONSOLE GBS: GBS: NEGATIVE

## 2015-11-22 MED ORDER — ONDANSETRON HCL 4 MG/2ML IJ SOLN: 4.0000 mg | Freq: Four times a day (QID) | INTRAMUSCULAR | Status: DC | PRN

## 2015-11-22 MED ORDER — OXYTOCIN 40 UNITS IN LACTATED RINGERS INFUSION - SIMPLE MED
2.5000 [IU]/h | INTRAVENOUS | Status: DC
  Filled 2015-11-22: qty 1000

## 2015-11-22 MED ORDER — OXYTOCIN BOLUS FROM INFUSION
500.0000 mL | INTRAVENOUS | Status: DC
  Administered 2015-11-23: 500 mL via INTRAVENOUS

## 2015-11-22 MED ORDER — FENTANYL 2.5 MCG/ML BUPIVACAINE 1/10 % EPIDURAL INFUSION (WH - ANES)
INTRAMUSCULAR | Status: DC | PRN
  Administered 2015-11-22: 3 mL/h via EPIDURAL

## 2015-11-22 MED ORDER — SOD CITRATE-CITRIC ACID 500-334 MG/5ML PO SOLN: 30.0000 mL | ORAL | Status: DC | PRN

## 2015-11-22 MED ORDER — OXYCODONE-ACETAMINOPHEN 5-325 MG PO TABS: 1.0000 | ORAL_TABLET | ORAL | Status: DC | PRN

## 2015-11-22 MED ORDER — OXYCODONE-ACETAMINOPHEN 5-325 MG PO TABS: 2.0000 | ORAL_TABLET | ORAL | Status: DC | PRN

## 2015-11-22 MED ORDER — BUTORPHANOL TARTRATE 1 MG/ML IJ SOLN: 1.0000 mg | INTRAMUSCULAR | Status: DC | PRN

## 2015-11-22 MED ORDER — LACTATED RINGERS IV SOLN
INTRAVENOUS | Status: DC
  Administered 2015-11-22: 22:00:00 via INTRAVENOUS

## 2015-11-22 MED ORDER — LACTATED RINGERS IV SOLN: 500.0000 mL | INTRAVENOUS | Status: DC | PRN

## 2015-11-22 MED ORDER — FLEET ENEMA 7-19 GM/118ML RE ENEM: 1.0000 | ENEMA | RECTAL | Status: DC | PRN

## 2015-11-22 MED ORDER — PHENYLEPHRINE 40 MCG/ML (10ML) SYRINGE FOR IV PUSH (FOR BLOOD PRESSURE SUPPORT)
PREFILLED_SYRINGE | INTRAVENOUS | Status: AC
  Filled 2015-11-22: qty 20

## 2015-11-22 MED ORDER — ACETAMINOPHEN 325 MG PO TABS: 650.0000 mg | ORAL_TABLET | ORAL | Status: DC | PRN

## 2015-11-22 MED ORDER — FENTANYL 2.5 MCG/ML BUPIVACAINE 1/10 % EPIDURAL INFUSION (WH - ANES)
INTRAMUSCULAR | Status: AC
  Administered 2015-11-22: 14 mL/h
  Filled 2015-11-22: qty 125

## 2015-11-22 MED ORDER — LIDOCAINE HCL (PF) 1 % IJ SOLN
30.0000 mL | INTRAMUSCULAR | Status: DC | PRN
  Filled 2015-11-22: qty 30

## 2015-11-22 NOTE — H&P (Signed)
Penny SealsKylie Knox is a 30 y.o. female presenting for SOL; CTX started at 630pm tonight.  She denies LOF and VB.  Active FM.  Antepartum course uncomplicated.  GBS negative.  Maternal Medical History:  Reason for admission: Contractions.   Contractions: Onset was 3-5 hours ago.   Frequency: regular.   Perceived severity is moderate.    Fetal activity: Perceived fetal activity is normal.   Last perceived fetal movement was within the past hour.    Prenatal complications: no prenatal complications Prenatal Complications - Diabetes: none.    OB History    Gravida Para Term Preterm AB TAB SAB Ectopic Multiple Living   2 1 1  0 0 0 0 0 0 1     History reviewed. No pertinent past medical history. History reviewed. No pertinent past surgical history. Family History: family history includes Cancer in her paternal grandfather; Depression in her brother and paternal grandmother; Diabetes in her maternal uncle; Hypertension in her maternal grandmother. Social History:  reports that she has never smoked. She has never used smokeless tobacco. She reports that she drinks alcohol. She reports that she does not use illicit drugs.   Prenatal Transfer Tool  Maternal Diabetes: No Genetic Screening: Normal Maternal Ultrasounds/Referrals: Normal Fetal Ultrasounds or other Referrals:  None Maternal Substance Abuse:  No Significant Maternal Medications:  None Significant Maternal Lab Results:  Lab values include: Group B Strep negative Other Comments:  None  ROS  Dilation: 5.5 Effacement (%): 50 Station: -3 Blood pressure 125/85, pulse 78, temperature 97.5 F (36.4 C), temperature source Oral, resp. rate 18, height 5\' 8"  (1.727 m), weight 183 lb (83.008 kg), last menstrual period 02/15/2015, unknown if currently breastfeeding. Maternal Exam:  Uterine Assessment: Contraction strength is moderate.  Contraction frequency is regular.   Abdomen: Patient reports no abdominal tenderness. Fundal height is  c/w dates.   Estimated fetal weight is 8#.       Physical Exam  Constitutional: She is oriented to person, place, and time. She appears well-developed and well-nourished.  GI: Soft. There is no rebound and no guarding.  Neurological: She is alert and oriented to person, place, and time.  Skin: Skin is warm and dry.  Psychiatric: She has a normal mood and affect. Her behavior is normal.    Prenatal labs: ABO, Rh: A/Positive/-- (11/22 0000) Antibody: Negative (11/22 0000) Rubella: Immune (11/22 0000) RPR: Nonreactive (11/22 0000)  HBsAg: Negative (11/22 0000)  HIV: Non-reactive (11/22 0000)  GBS: Negative (06/28 0000)   Assessment/Plan: 29yo G2P1001 at 6349w0d with SOL -Epidural -Anticipate NSVD   Keelen Quevedo 11/22/2015, 9:46 PM

## 2015-11-22 NOTE — Anesthesia Preprocedure Evaluation (Signed)

## 2015-11-22 NOTE — Telephone Encounter (Signed)
Preadmission screen  

## 2015-11-22 NOTE — Anesthesia Procedure Notes (Signed)
Epidural Patient location during procedure: OB  Preanesthetic Checklist Completed: patient identified, site marked, surgical consent, pre-op evaluation, timeout performed, IV checked, risks and benefits discussed and monitors and equipment checked  Epidural Patient position: sitting Prep: site prepped and draped and DuraPrep Patient monitoring: continuous pulse ox and blood pressure Approach: midline Location: L3-L4 Injection technique: LOR air  Needle:  Needle type: Tuohy  Needle gauge: 17 G Needle length: 9 cm and 9 Needle insertion depth: 5 cm cm Catheter type: closed end flexible Catheter size: 19 Gauge Catheter at skin depth: 10 cm Test dose: negative  Assessment Events: blood not aspirated, injection not painful, no injection resistance, negative IV test and no paresthesia  Additional Notes Dosed 3cc from pump thru sprotte 25GA through touhy (-) asp on catheter Pump started after taped to back Pt comfortable

## 2015-11-23 ENCOUNTER — Encounter (HOSPITAL_COMMUNITY): Payer: Self-pay | Admitting: *Deleted

## 2015-11-23 LAB — RPR: RPR Ser Ql: NONREACTIVE

## 2015-11-23 MED ORDER — OXYCODONE-ACETAMINOPHEN 5-325 MG PO TABS: 1.0000 | ORAL_TABLET | ORAL | Status: DC | PRN

## 2015-11-23 MED ORDER — IBUPROFEN 600 MG PO TABS
600.0000 mg | ORAL_TABLET | Freq: Four times a day (QID) | ORAL | Status: DC
  Administered 2015-11-23 – 2015-11-24 (×6): 600 mg via ORAL
  Filled 2015-11-23 (×6): qty 1

## 2015-11-23 MED ORDER — OXYCODONE-ACETAMINOPHEN 5-325 MG PO TABS: 2.0000 | ORAL_TABLET | ORAL | Status: DC | PRN

## 2015-11-23 MED ORDER — BENZOCAINE-MENTHOL 20-0.5 % EX AERO
1.0000 "application " | INHALATION_SPRAY | CUTANEOUS | Status: DC | PRN
  Administered 2015-11-23: 1 via TOPICAL
  Filled 2015-11-23 (×3): qty 56

## 2015-11-23 MED ORDER — ZOLPIDEM TARTRATE 5 MG PO TABS: 5.0000 mg | ORAL_TABLET | Freq: Every evening | ORAL | Status: DC | PRN

## 2015-11-23 MED ORDER — COCONUT OIL OIL
1.0000 "application " | TOPICAL_OIL | Status: DC | PRN
  Filled 2015-11-23 (×2): qty 120

## 2015-11-23 MED ORDER — TETANUS-DIPHTH-ACELL PERTUSSIS 5-2.5-18.5 LF-MCG/0.5 IM SUSP: 0.5000 mL | Freq: Once | INTRAMUSCULAR | Status: DC

## 2015-11-23 MED ORDER — ACETAMINOPHEN 325 MG PO TABS: 650.0000 mg | ORAL_TABLET | ORAL | Status: DC | PRN

## 2015-11-23 MED ORDER — ONDANSETRON HCL 4 MG/2ML IJ SOLN: 4.0000 mg | INTRAMUSCULAR | Status: DC | PRN

## 2015-11-23 MED ORDER — DIPHENHYDRAMINE HCL 25 MG PO CAPS: 25.0000 mg | ORAL_CAPSULE | Freq: Four times a day (QID) | ORAL | Status: DC | PRN

## 2015-11-23 MED ORDER — SIMETHICONE 80 MG PO CHEW
80.0000 mg | CHEWABLE_TABLET | ORAL | Status: DC | PRN
Start: 2015-11-23 — End: 2015-11-24

## 2015-11-23 MED ORDER — PRENATAL MULTIVITAMIN CH
1.0000 | ORAL_TABLET | Freq: Every day | ORAL | Status: DC
  Administered 2015-11-23 – 2015-11-24 (×2): 1 via ORAL
  Filled 2015-11-23 (×2): qty 1

## 2015-11-23 MED ORDER — SENNOSIDES-DOCUSATE SODIUM 8.6-50 MG PO TABS
2.0000 | ORAL_TABLET | ORAL | Status: DC
  Administered 2015-11-24: 2 via ORAL
  Filled 2015-11-23: qty 2

## 2015-11-23 MED ORDER — DIBUCAINE 1 % RE OINT
1.0000 "application " | TOPICAL_OINTMENT | RECTAL | Status: DC | PRN
  Filled 2015-11-23: qty 28.4

## 2015-11-23 MED ORDER — WITCH HAZEL-GLYCERIN EX PADS: 1.0000 "application " | MEDICATED_PAD | CUTANEOUS | Status: DC | PRN

## 2015-11-23 MED ORDER — ONDANSETRON HCL 4 MG PO TABS: 4.0000 mg | ORAL_TABLET | ORAL | Status: DC | PRN

## 2015-11-23 NOTE — Lactation Note (Signed)
This note was copied from a baby's chart. Lactation Consultation Note  Patient Name: Boy Humberto SealsKylie Hilgeman ZOXWR'UToday's Date: 11/23/2015 Reason for consult: Initial assessment  Baby 14 hours old. [facility] nursing [facility]. Mom has baby latched in a modified laid back position, and baby is latched deeply with lips flanged and a few swallows noted. Mom reports that baby's last stool is already transitioning to brown. Discussed progression of milk coming to volume and enc nursing with cues and alternating breasts. [facility], aware of OP/BFSG and LC phone line assistance after D/C.  Maternal Data Does the patient have breastfeeding experience prior to this delivery?: Yes  Feeding Feeding Type: Breast Fed Length of feed:  (LC assessed 10 minutes of BF. )  LATCH Score/Interventions Latch: Grasps breast easily, tongue down, lips flanged, rhythmical sucking.  Audible Swallowing: A few with stimulation Intervention(s): Skin to skin  Type of Nipple: Everted at rest and after stimulation  Comfort (Breast/Nipple): Soft / non-tender     Hold (Positioning): No assistance needed to correctly position infant at breast.  LATCH Score: 9  Lactation Tools Discussed/Used     Consult Status Consult Status: Follow-up Date: 11/24/15 Follow-up type: In-patient    Geralynn OchsWILLIARD, Keaundra Stehle 11/23/2015, 5:23 PM

## 2015-11-23 NOTE — Anesthesia Postprocedure Evaluation (Signed)
Anesthesia Post Note  Patient: Humberto SealsKylie Holt  Procedure(s) Performed: * No procedures listed *  Patient location during evaluation: Mother Baby Anesthesia Type: Epidural Level of consciousness: awake, awake and alert, oriented and patient cooperative Pain management: pain level controlled Vital Signs Assessment: post-procedure vital signs reviewed and stable Respiratory status: spontaneous breathing, nonlabored ventilation and respiratory function stable Cardiovascular status: stable Postop Assessment: no headache, no backache, patient able to bend at knees and no signs of nausea or vomiting Anesthetic complications: no     Last Vitals:  Filed Vitals:   11/23/15 0830 11/23/15 1330  BP: 110/74 116/74  Pulse: 71 65  Temp: 36.6 C 36.6 C  Resp: 16 14    Last Pain:  Filed Vitals:   11/23/15 1333  PainSc: 2    Pain Goal: Patients Stated Pain Goal: 2 (11/22/15 2258)               Maleni Seyer L

## 2015-11-23 NOTE — Progress Notes (Signed)
Post Partum Day 0 Subjective: no complaints, up ad lib, voiding and tolerating PO  Objective: Blood pressure 111/72, pulse 78, temperature 97.9 F (36.6 C), temperature source Oral, resp. rate 14, height 5\' 8"  (1.727 m), weight 183 lb (83.008 kg), last menstrual period 02/15/2015, SpO2 100 %, unknown if currently breastfeeding.  Physical Exam:  General: alert and cooperative Lochia: appropriate Uterine Fundus: firm Incision: healing well DVT Evaluation: No evidence of DVT seen on physical exam. Negative Homan's sign. No cords or calf tenderness. No significant calf/ankle edema.   Recent Labs  11/22/15 2205  HGB 13.2  HCT 38.2    Assessment/Plan: Plan for discharge tomorrow and Circumcision prior to discharge   LOS: 1 day   CURTIS,CAROL G 11/23/2015, 8:54 AM

## 2015-11-24 LAB — CBC
HEMATOCRIT: 36 % (ref 36.0–46.0)
HEMOGLOBIN: 12.2 g/dL (ref 12.0–15.0)
MCH: 29.3 pg (ref 26.0–34.0)
MCHC: 33.9 g/dL (ref 30.0–36.0)
MCV: 86.3 fL (ref 78.0–100.0)
PLATELETS: 210 10*3/uL (ref 150–400)
RBC: 4.17 MIL/uL (ref 3.87–5.11)
RDW: 14.1 % (ref 11.5–15.5)
WBC: 10.2 10*3/uL (ref 4.0–10.5)

## 2015-11-24 MED ORDER — IBUPROFEN 600 MG PO TABS: 600.0000 mg | ORAL_TABLET | Freq: Four times a day (QID) | ORAL | Status: DC

## 2015-11-24 NOTE — Discharge Summary (Signed)
Obstetric Discharge Summary Reason for Admission: onset of labor Prenatal Procedures: ultrasound Intrapartum Procedures: spontaneous vaginal delivery Postpartum Procedures: none Complications-Operative and Postpartum: 2 degree perineal laceration HEMOGLOBIN  Date Value Ref Range Status  11/24/2015 12.2 12.0 - 15.0 g/dL Final   HCT  Date Value Ref Range Status  11/24/2015 36.0 36.0 - 46.0 % Final    Physical Exam:  General: alert and cooperative Lochia: appropriate Uterine Fundus: firm Incision: healing well DVT Evaluation: No evidence of DVT seen on physical exam. Negative Homan's sign. No cords or calf tenderness. No significant calf/ankle edema.  Discharge Diagnoses: Term Pregnancy-delivered  Discharge Information: Date: 11/24/2015 Activity: pelvic rest Diet: routine Medications: PNV and Ibuprofen Condition: stable Instructions: refer to practice specific booklet Discharge to: home   Newborn Data: Live born female  Birth Weight: 8 lb 15.4 oz (4065 g) APGAR: 8, 10  Home with mother.  Erina Hamme G 11/24/2015, 8:50 AM

## 2015-11-24 NOTE — Lactation Note (Signed)
This note was copied from a baby's chart. Lactation Consultation Note  Follow up visit made prior to discharge.  Mom states newborn is breastfeeding well and no concerns at present.  Reviewed using good breast massage during feeding to keep baby engaged and increase flow/volume.  Instructed to keep feeding diary the first week.  [facility].  [facility] and support information reviewed and encouraged prn.  Patient Name: Penny Humberto SealsKylie Berhow FAOZH'YToday's Date: 11/24/2015     Maternal Data    Feeding    LATCH Score/Interventions                      Lactation Tools Discussed/Used     Consult Status      Huston FoleyMOULDEN, Jandy Brackens S 11/24/2015, 11:19 AM

## 2015-11-27 ENCOUNTER — Inpatient Hospital Stay (HOSPITAL_COMMUNITY): Admission: RE | Admit: 2015-11-27 | Payer: 59 | Source: Ambulatory Visit

## 2016-10-24 DIAGNOSIS — J029 Acute pharyngitis, unspecified: Secondary | ICD-10-CM | POA: Diagnosis not present

## 2016-10-24 DIAGNOSIS — J358 Other chronic diseases of tonsils and adenoids: Secondary | ICD-10-CM | POA: Diagnosis not present

## 2017-01-20 DIAGNOSIS — Z6824 Body mass index (BMI) 24.0-24.9, adult: Secondary | ICD-10-CM | POA: Diagnosis not present

## 2017-01-20 DIAGNOSIS — Z01419 Encounter for gynecological examination (general) (routine) without abnormal findings: Secondary | ICD-10-CM | POA: Diagnosis not present

## 2017-01-20 LAB — HM PAP SMEAR: HM PAP: NEGATIVE

## 2017-05-22 DIAGNOSIS — R05 Cough: Secondary | ICD-10-CM | POA: Diagnosis not present

## 2017-05-22 DIAGNOSIS — R0982 Postnasal drip: Secondary | ICD-10-CM | POA: Diagnosis not present

## 2017-05-22 DIAGNOSIS — J019 Acute sinusitis, unspecified: Secondary | ICD-10-CM | POA: Diagnosis not present

## 2017-07-22 DIAGNOSIS — J029 Acute pharyngitis, unspecified: Secondary | ICD-10-CM | POA: Diagnosis not present

## 2017-09-01 DIAGNOSIS — S60032A Contusion of left middle finger without damage to nail, initial encounter: Secondary | ICD-10-CM | POA: Diagnosis not present

## 2017-09-12 DIAGNOSIS — J019 Acute sinusitis, unspecified: Secondary | ICD-10-CM | POA: Diagnosis not present

## 2017-09-12 DIAGNOSIS — L255 Unspecified contact dermatitis due to plants, except food: Secondary | ICD-10-CM | POA: Diagnosis not present

## 2017-10-03 DIAGNOSIS — L237 Allergic contact dermatitis due to plants, except food: Secondary | ICD-10-CM | POA: Diagnosis not present

## 2017-10-13 ENCOUNTER — Ambulatory Visit (INDEPENDENT_AMBULATORY_CARE_PROVIDER_SITE_OTHER): Payer: 59 | Admitting: Family Medicine

## 2017-10-13 ENCOUNTER — Encounter: Payer: Self-pay | Admitting: Family Medicine

## 2017-10-13 VITALS — BP 120/84 | HR 65 | Ht 68.0 in | Wt 159.3 lb

## 2017-10-13 DIAGNOSIS — Z83438 Family history of other disorder of lipoprotein metabolism and other lipidemia: Secondary | ICD-10-CM

## 2017-10-13 DIAGNOSIS — Z800 Family history of malignant neoplasm of digestive organs: Secondary | ICD-10-CM | POA: Diagnosis not present

## 2017-10-13 DIAGNOSIS — J302 Other seasonal allergic rhinitis: Secondary | ICD-10-CM

## 2017-10-13 DIAGNOSIS — Z8249 Family history of ischemic heart disease and other diseases of the circulatory system: Secondary | ICD-10-CM

## 2017-10-13 DIAGNOSIS — Z801 Family history of malignant neoplasm of trachea, bronchus and lung: Secondary | ICD-10-CM

## 2017-10-13 NOTE — Patient Instructions (Signed)

## 2017-10-13 NOTE — Progress Notes (Signed)
New patient office visit note:  Impression and Recommendations:    1. Seasonal allergies   2. Family history of lung cancer   3. Family history of colon cancer   4. Family history of hyperlipidemia   5. Family history of hypertension    -Discussed over-the-counter regimens for her allergies which are pretty well controlled at current. -With patient's family history and due to the fact she has not had any screening labs or routine care in many years, she will make an appointment at her convenience in the near future for a yearly physical at that time we will get fasting blood work   Education and routine counseling performed. Handouts provided.  Gross side effects, risk and benefits, and alternatives of medications discussed with patient.  Patient is aware that all medications have potential side effects and we are unable to predict every side effect or drug-drug interaction that may occur.  Expresses verbal understanding and consents to current therapy plan and treatment regimen.  Return for 38mo or prn for CPE /yearly physical, come fasting for blood work.  Please see AVS handed out to patient at the end of our visit for further patient instructions/ counseling done pertaining to today's office visit.    Note: This document was prepared using Dragon voice recognition software and may include unintentional dictation errors.     Thomasene Lot 10/13/17 5:19 PM   ----------------------------------------------------------------------------------------------------------------------    Subjective:    Chief complaint:   Chief Complaint  Patient presents with  . Establish Care     HPI: Penny Knox is a pleasant 32 y.o. female who presents to [facility] today to review their medical history with me and establish care.   I asked the patient to review their chronic problem list with me to ensure everything was updated and accurate.    All  recent office visits with other providers, any medical records that patient brought in etc  - I reviewed today.     We asked pt to get [place] their medical records from [facility] providers/ specialists that they had seen within the past 3-5 years- if they are in private practice and/or do not work for [employer], [facility], [place], [facility] or [employer] owned practice.  Told them to call their specialists to clarify this if they are not sure.    Patient is a [facility]  She has not had a regular PCP for many many years since she has had a pediatrician when young.  She has no particular complaints today but thought it was time she obtain a doctor for regular routine screening and all.  Patient is only had a personal history of seasonal allergies and essentially is healthy otherwise.  She is on birth control pills per Dr. Legrand Pitts and gets all her female care with them.  She is an Char[employer] fundraiser and has not had any fasting blood work or screening blood work in many years.  She is going for consultation for Lasix eye surgery in the near future and thought she should get a doctor prior to that surgery.  She denies any history of elevated blood pressure and usually hers runs around 110s over low 70s.  Does have a concern about a mole on her right upper back region.  Been there many years.  Not changing.  Occasionally gets caught on brought and irritates causing pain.  No family history of skin cancers and denies  any changes in skin lesions.   Wt Readings from Last 3 Encounters:  10/13/17 159 lb 4.8 oz (72.3 kg)  11/22/15 183 lb (83 kg)  01/17/14 182 lb (82.6 kg)   BP Readings from Last 3 Encounters:  10/13/17 120/84  11/24/15 110/68  01/18/14 128/90   Pulse Readings from Last 3 Encounters:  10/13/17 65  11/24/15 61  01/18/14 82   BMI Readings from Last 3 Encounters:  10/13/17 24.22 kg/m  11/22/15 27.83 kg/m  01/17/14 27.67 kg/m    Patient Care Team    Relationship  Specialty Notifications Start End  Thomasene Lot, DO PCP - General Family Medicine  10/13/17     Patient Active Problem List   Diagnosis Date Noted  . Seasonal allergies 10/13/2017  . Family history of lung cancer 10/13/2017  . Family history of colon cancer 10/13/2017  . Family history of hyperlipidemia 10/13/2017  . Family history of hypertension 10/13/2017     Past Medical History:  Diagnosis Date  . Seasonal allergies      Past Medical History:  Diagnosis Date  . Seasonal allergies      No past surgical history on file.   Family History  Problem Relation Age of Onset  . Depression Brother   . Diabetes Maternal Uncle   . Hypertension Maternal Grandmother   . Depression Paternal Grandmother   . Cancer Paternal Grandfather        colon     Social History   Substance and Sexual Activity  Drug Use No     Social History   Substance and Sexual Activity  Alcohol Use Yes  . Alcohol/week: 0.6 - 1.2 oz  . Types: 1 - 2 Standard drinks or equivalent per week     Social History   Tobacco Use  Smoking Status Never Smoker  Smokeless Tobacco Never Used     Current Meds  Medication Sig  . cetirizine (ZYRTEC) 10 MG tablet Take 10 mg by mouth daily.  . Multiple Vitamin (MULTIVITAMIN) capsule Take 1 capsule by mouth daily.  . TRI-SPRINTEC 0.18/0.215/0.25 MG-35 MCG tablet Take 1 tablet by mouth daily.    Allergies: Patient has no known allergies.   Review of Systems  Constitutional: Negative for chills, diaphoresis, fever, malaise/fatigue and weight loss.  HENT: Negative for congestion, sore throat and tinnitus.   Eyes: Negative for blurred vision, double vision and photophobia.  Respiratory: Negative for cough and wheezing.   Cardiovascular: Negative for chest pain and palpitations.  Gastrointestinal: Negative for blood in stool, diarrhea, nausea and vomiting.  Genitourinary: Negative for dysuria, frequency and urgency.  Musculoskeletal: Negative  for joint pain and myalgias.  Skin: Negative for itching and rash.  Neurological: Negative for dizziness, focal weakness, weakness and headaches.  Endo/Heme/Allergies: Negative for environmental allergies and polydipsia. Does not bruise/bleed easily.  Psychiatric/Behavioral: Negative for depression and memory loss. The patient is not nervous/anxious and does not have insomnia.      Objective:   Blood pressure 120/84, pulse 65, height  (1.727 m), weight 159 lb 4.8 oz (72.3 kg), SpO2 100 %, not currently breastfeeding. Body mass index is 24.22 kg/m. General: Well Developed, well nourished, and in no acute distress.  Neuro: Alert and oriented x3, extra-ocular muscles intact, sensation grossly intact.  HEENT:[place]/AT, PERRLA, neck supple, No carotid bruits Skin: no gross rashes  Cardiac: Regular rate and rhythm Respiratory: Essentially clear to auscultation bilaterally. Not using accessory muscles, speaking in full sentences.  Abdominal: not grossly distended Musculoskeletal: Ambulates w/o  diff, FROM * 4 ext.  Vasc: less 2 sec cap RF, warm and pink  Psych:  No HI/SI, judgement and insight good, Euthymic mood. Full Affect.    No results found for this or any previous visit (from the past 2160 hour(s)).

## 2018-02-04 DIAGNOSIS — Z6823 Body mass index (BMI) 23.0-23.9, adult: Secondary | ICD-10-CM | POA: Diagnosis not present

## 2018-02-04 DIAGNOSIS — Z01419 Encounter for gynecological examination (general) (routine) without abnormal findings: Secondary | ICD-10-CM | POA: Diagnosis not present

## 2018-03-16 DIAGNOSIS — Z23 Encounter for immunization: Secondary | ICD-10-CM | POA: Diagnosis not present

## 2018-04-01 DIAGNOSIS — N911 Secondary amenorrhea: Secondary | ICD-10-CM | POA: Diagnosis not present

## 2018-04-07 DIAGNOSIS — Z3481 Encounter for supervision of other normal pregnancy, first trimester: Secondary | ICD-10-CM | POA: Diagnosis not present

## 2018-04-07 DIAGNOSIS — Z3685 Encounter for antenatal screening for Streptococcus B: Secondary | ICD-10-CM | POA: Diagnosis not present

## 2018-04-07 LAB — OB RESULTS CONSOLE RPR: RPR: NONREACTIVE

## 2018-04-07 LAB — OB RESULTS CONSOLE ABO/RH: RH Type: POSITIVE

## 2018-04-07 LAB — OB RESULTS CONSOLE ANTIBODY SCREEN: Antibody Screen: NEGATIVE

## 2018-04-07 LAB — OB RESULTS CONSOLE HIV ANTIBODY (ROUTINE TESTING): HIV: NONREACTIVE

## 2018-04-07 LAB — OB RESULTS CONSOLE HEPATITIS B SURFACE ANTIGEN: Hepatitis B Surface Ag: NEGATIVE

## 2018-04-07 LAB — OB RESULTS CONSOLE RUBELLA ANTIBODY, IGM: Rubella: IMMUNE

## 2018-05-05 DIAGNOSIS — O09521 Supervision of elderly multigravida, first trimester: Secondary | ICD-10-CM | POA: Diagnosis not present

## 2018-05-05 DIAGNOSIS — Z3A12 12 weeks gestation of pregnancy: Secondary | ICD-10-CM | POA: Diagnosis not present

## 2018-05-08 ENCOUNTER — Other Ambulatory Visit: Payer: Self-pay | Admitting: Obstetrics and Gynecology

## 2018-05-08 DIAGNOSIS — N6310 Unspecified lump in the right breast, unspecified quadrant: Secondary | ICD-10-CM

## 2018-05-08 DIAGNOSIS — N6320 Unspecified lump in the left breast, unspecified quadrant: Secondary | ICD-10-CM

## 2018-05-08 DIAGNOSIS — M7989 Other specified soft tissue disorders: Secondary | ICD-10-CM

## 2018-05-13 ENCOUNTER — Other Ambulatory Visit: Payer: Self-pay | Admitting: Obstetrics and Gynecology

## 2018-05-13 ENCOUNTER — Ambulatory Visit
Admission: RE | Admit: 2018-05-13 | Discharge: 2018-05-13 | Disposition: A | Discharge status: Home / Self Care | Payer: 59 | Source: Ambulatory Visit | Attending: Obstetrics and Gynecology | Admitting: Obstetrics and Gynecology

## 2018-05-13 DIAGNOSIS — R2231 Localized swelling, mass and lump, right upper limb: Secondary | ICD-10-CM

## 2018-05-13 DIAGNOSIS — M7989 Other specified soft tissue disorders: Secondary | ICD-10-CM

## 2018-05-13 DIAGNOSIS — N6310 Unspecified lump in the right breast, unspecified quadrant: Secondary | ICD-10-CM

## 2018-05-13 DIAGNOSIS — N6320 Unspecified lump in the left breast, unspecified quadrant: Secondary | ICD-10-CM

## 2018-05-13 DIAGNOSIS — R229 Localized swelling, mass and lump, unspecified: Secondary | ICD-10-CM | POA: Diagnosis not present

## 2018-05-13 DIAGNOSIS — R2232 Localized swelling, mass and lump, left upper limb: Secondary | ICD-10-CM

## 2018-05-27 NOTE — L&D Delivery Note (Signed)
Delivery Note At 8:20 AM a viable female was delivered via Vaginal, Spontaneous   Presentation LOA Apgars pending Weight pending Placenta routine WNL Cord PH not sent  Complications none   Anesthesia:  CLE Episiotomy: None Lacerations: 2nd degree Suture Repair: 3.0 Est. Blood Loss (mL): 200   It's a girl - "Penny Knox" to join brothers at home (2 and 35yo) Mom to postpartum.  Baby to Couplet care / Skin to Skin.  Tyson Dense 11/17/2018, 9:28 AM

## 2018-06-03 DIAGNOSIS — J019 Acute sinusitis, unspecified: Secondary | ICD-10-CM | POA: Diagnosis not present

## 2018-06-18 DIAGNOSIS — Z3A18 18 weeks gestation of pregnancy: Secondary | ICD-10-CM | POA: Diagnosis not present

## 2018-08-19 DIAGNOSIS — Z23 Encounter for immunization: Secondary | ICD-10-CM | POA: Diagnosis not present

## 2018-08-19 DIAGNOSIS — Z3480 Encounter for supervision of other normal pregnancy, unspecified trimester: Secondary | ICD-10-CM | POA: Diagnosis not present

## 2018-10-16 DIAGNOSIS — Z3685 Encounter for antenatal screening for Streptococcus B: Secondary | ICD-10-CM | POA: Diagnosis not present

## 2018-10-16 LAB — OB RESULTS CONSOLE GBS: GBS: NEGATIVE

## 2018-10-22 DIAGNOSIS — Z3A36 36 weeks gestation of pregnancy: Secondary | ICD-10-CM | POA: Diagnosis not present

## 2018-10-22 DIAGNOSIS — O26893 Other specified pregnancy related conditions, third trimester: Secondary | ICD-10-CM | POA: Diagnosis not present

## 2018-11-05 ENCOUNTER — Telehealth (HOSPITAL_COMMUNITY): Payer: Self-pay | Admitting: *Deleted

## 2018-11-05 ENCOUNTER — Encounter (HOSPITAL_COMMUNITY): Payer: Self-pay | Admitting: *Deleted

## 2018-11-05 NOTE — Telephone Encounter (Signed)
Preadmission screen  

## 2018-11-13 ENCOUNTER — Other Ambulatory Visit: Payer: Self-pay

## 2018-11-13 ENCOUNTER — Other Ambulatory Visit (HOSPITAL_COMMUNITY)
Admission: RE | Admit: 2018-11-13 | Discharge: 2018-11-13 | Disposition: A | Discharge status: Home / Self Care | Payer: 59 | Source: Ambulatory Visit | Attending: Obstetrics & Gynecology | Admitting: Obstetrics & Gynecology

## 2018-11-13 DIAGNOSIS — Z1159 Encounter for screening for other viral diseases: Secondary | ICD-10-CM | POA: Diagnosis not present

## 2018-11-13 LAB — SARS CORONAVIRUS 2 (TAT 6-24 HRS): SARS Coronavirus 2: NEGATIVE

## 2018-11-13 NOTE — MAU Note (Signed)
Asymptomatic, swab collected without problem. 

## 2018-11-16 ENCOUNTER — Encounter (HOSPITAL_COMMUNITY): Payer: 59

## 2018-11-17 ENCOUNTER — Inpatient Hospital Stay (HOSPITAL_COMMUNITY): Payer: 59 | Admitting: Anesthesiology

## 2018-11-17 ENCOUNTER — Encounter (HOSPITAL_COMMUNITY): Payer: Self-pay

## 2018-11-17 ENCOUNTER — Other Ambulatory Visit: Payer: Self-pay

## 2018-11-17 ENCOUNTER — Inpatient Hospital Stay (HOSPITAL_COMMUNITY): Payer: 59

## 2018-11-17 ENCOUNTER — Encounter (HOSPITAL_COMMUNITY): Payer: 59

## 2018-11-17 ENCOUNTER — Inpatient Hospital Stay (HOSPITAL_COMMUNITY)
Admission: RE | Admit: 2018-11-17 | Discharge: 2018-11-18 | DRG: 807 | Disposition: A | Discharge status: Home / Self Care | Payer: 59 | Attending: Obstetrics and Gynecology | Admitting: Obstetrics and Gynecology

## 2018-11-17 DIAGNOSIS — Z3490 Encounter for supervision of normal pregnancy, unspecified, unspecified trimester: Secondary | ICD-10-CM

## 2018-11-17 DIAGNOSIS — O26893 Other specified pregnancy related conditions, third trimester: Secondary | ICD-10-CM | POA: Diagnosis present

## 2018-11-17 DIAGNOSIS — Z3A39 39 weeks gestation of pregnancy: Secondary | ICD-10-CM

## 2018-11-17 DIAGNOSIS — Z3A40 40 weeks gestation of pregnancy: Secondary | ICD-10-CM | POA: Diagnosis not present

## 2018-11-17 LAB — CBC
HCT: 38.9 % (ref 36.0–46.0)
Hemoglobin: 12.6 g/dL (ref 12.0–15.0)
MCH: 27.5 pg (ref 26.0–34.0)
MCHC: 32.4 g/dL (ref 30.0–36.0)
MCV: 84.9 fL (ref 80.0–100.0)
Platelets: 331 10*3/uL (ref 150–400)
RBC: 4.58 MIL/uL (ref 3.87–5.11)
RDW: 12.9 % (ref 11.5–15.5)
WBC: 11.4 10*3/uL — ABNORMAL HIGH (ref 4.0–10.5)
nRBC: 0 % (ref 0.0–0.2)

## 2018-11-17 LAB — ABO/RH: ABO/RH(D): A POS

## 2018-11-17 LAB — TYPE AND SCREEN
ABO/RH(D): A POS
Antibody Screen: NEGATIVE

## 2018-11-17 LAB — RPR: RPR Ser Ql: NONREACTIVE

## 2018-11-17 MED ORDER — LACTATED RINGERS IV SOLN: 500.0000 mL | Freq: Once | INTRAVENOUS | Status: DC

## 2018-11-17 MED ORDER — SOD CITRATE-CITRIC ACID 500-334 MG/5ML PO SOLN: 30.0000 mL | ORAL | Status: DC | PRN

## 2018-11-17 MED ORDER — TETANUS-DIPHTH-ACELL PERTUSSIS 5-2.5-18.5 LF-MCG/0.5 IM SUSP: 0.5000 mL | Freq: Once | INTRAMUSCULAR | Status: DC

## 2018-11-17 MED ORDER — OXYTOCIN 40 UNITS IN NORMAL SALINE INFUSION - SIMPLE MED: 2.5000 [IU]/h | INTRAVENOUS | Status: DC

## 2018-11-17 MED ORDER — FENTANYL-BUPIVACAINE-NACL 0.5-0.125-0.9 MG/250ML-% EP SOLN
EPIDURAL | Status: AC
  Filled 2018-11-17: qty 250

## 2018-11-17 MED ORDER — BENZOCAINE-MENTHOL 20-0.5 % EX AERO
1.0000 "application " | INHALATION_SPRAY | CUTANEOUS | Status: DC | PRN
  Administered 2018-11-17: 1 via TOPICAL
  Filled 2018-11-17: qty 56

## 2018-11-17 MED ORDER — OXYTOCIN BOLUS FROM INFUSION
500.0000 mL | Freq: Once | INTRAVENOUS | Status: AC
  Administered 2018-11-17: 500 mL via INTRAVENOUS

## 2018-11-17 MED ORDER — ONDANSETRON HCL 4 MG PO TABS: 4.0000 mg | ORAL_TABLET | ORAL | Status: DC | PRN

## 2018-11-17 MED ORDER — ZOLPIDEM TARTRATE 5 MG PO TABS: 5.0000 mg | ORAL_TABLET | Freq: Every evening | ORAL | Status: DC | PRN

## 2018-11-17 MED ORDER — OXYCODONE-ACETAMINOPHEN 5-325 MG PO TABS: 2.0000 | ORAL_TABLET | ORAL | Status: DC | PRN

## 2018-11-17 MED ORDER — ONDANSETRON HCL 4 MG/2ML IJ SOLN: 4.0000 mg | INTRAMUSCULAR | Status: DC | PRN

## 2018-11-17 MED ORDER — EPHEDRINE 5 MG/ML INJ: 10.0000 mg | INTRAVENOUS | Status: DC | PRN

## 2018-11-17 MED ORDER — SENNOSIDES-DOCUSATE SODIUM 8.6-50 MG PO TABS
2.0000 | ORAL_TABLET | ORAL | Status: DC
  Administered 2018-11-17: 2 via ORAL
  Filled 2018-11-17: qty 2

## 2018-11-17 MED ORDER — LIDOCAINE HCL (PF) 1 % IJ SOLN: 30.0000 mL | INTRAMUSCULAR | Status: DC | PRN

## 2018-11-17 MED ORDER — HYDROXYZINE HCL 50 MG PO TABS: 50.0000 mg | ORAL_TABLET | Freq: Four times a day (QID) | ORAL | Status: DC | PRN

## 2018-11-17 MED ORDER — PHENYLEPHRINE 40 MCG/ML (10ML) SYRINGE FOR IV PUSH (FOR BLOOD PRESSURE SUPPORT): 80.0000 ug | PREFILLED_SYRINGE | INTRAVENOUS | Status: DC | PRN

## 2018-11-17 MED ORDER — FENTANYL-BUPIVACAINE-NACL 0.5-0.125-0.9 MG/250ML-% EP SOLN: 12.0000 mL/h | EPIDURAL | Status: DC | PRN

## 2018-11-17 MED ORDER — SODIUM CHLORIDE (PF) 0.9 % IJ SOLN
INTRAMUSCULAR | Status: DC | PRN
  Administered 2018-11-17: 14 mL/h via EPIDURAL

## 2018-11-17 MED ORDER — PHENYLEPHRINE 40 MCG/ML (10ML) SYRINGE FOR IV PUSH (FOR BLOOD PRESSURE SUPPORT)
80.0000 ug | PREFILLED_SYRINGE | INTRAVENOUS | Status: DC | PRN
  Filled 2018-11-17: qty 10

## 2018-11-17 MED ORDER — COCONUT OIL OIL: 1.0000 "application " | TOPICAL_OIL | Status: DC | PRN

## 2018-11-17 MED ORDER — DIPHENHYDRAMINE HCL 50 MG/ML IJ SOLN: 12.5000 mg | INTRAMUSCULAR | Status: DC | PRN

## 2018-11-17 MED ORDER — OXYCODONE HCL 5 MG PO TABS: 10.0000 mg | ORAL_TABLET | ORAL | Status: DC | PRN

## 2018-11-17 MED ORDER — BUTORPHANOL TARTRATE 1 MG/ML IJ SOLN: 1.0000 mg | INTRAMUSCULAR | Status: DC | PRN

## 2018-11-17 MED ORDER — LORATADINE 10 MG PO TABS: 10.0000 mg | ORAL_TABLET | Freq: Every day | ORAL | Status: DC

## 2018-11-17 MED ORDER — OXYCODONE HCL 5 MG PO TABS: 5.0000 mg | ORAL_TABLET | ORAL | Status: DC | PRN

## 2018-11-17 MED ORDER — SIMETHICONE 80 MG PO CHEW: 80.0000 mg | CHEWABLE_TABLET | ORAL | Status: DC | PRN

## 2018-11-17 MED ORDER — LACTATED RINGERS IV SOLN
INTRAVENOUS | Status: DC
  Administered 2018-11-17: 01:00:00 via INTRAVENOUS

## 2018-11-17 MED ORDER — ACETAMINOPHEN 325 MG PO TABS: 650.0000 mg | ORAL_TABLET | ORAL | Status: DC | PRN

## 2018-11-17 MED ORDER — ONDANSETRON HCL 4 MG/2ML IJ SOLN: 4.0000 mg | Freq: Four times a day (QID) | INTRAMUSCULAR | Status: DC | PRN

## 2018-11-17 MED ORDER — LACTATED RINGERS IV SOLN: 500.0000 mL | INTRAVENOUS | Status: DC | PRN

## 2018-11-17 MED ORDER — OXYCODONE-ACETAMINOPHEN 5-325 MG PO TABS: 1.0000 | ORAL_TABLET | ORAL | Status: DC | PRN

## 2018-11-17 MED ORDER — LIDOCAINE HCL (PF) 1 % IJ SOLN
INTRAMUSCULAR | Status: DC | PRN
  Administered 2018-11-17: 5 mL via EPIDURAL
  Administered 2018-11-17: 4 mL via EPIDURAL

## 2018-11-17 MED ORDER — DIBUCAINE (PERIANAL) 1 % EX OINT: 1.0000 "application " | TOPICAL_OINTMENT | CUTANEOUS | Status: DC | PRN

## 2018-11-17 MED ORDER — OXYTOCIN 40 UNITS IN NORMAL SALINE INFUSION - SIMPLE MED
1.0000 m[IU]/min | INTRAVENOUS | Status: DC
  Administered 2018-11-17: 2 m[IU]/min via INTRAVENOUS
  Filled 2018-11-17: qty 1000

## 2018-11-17 MED ORDER — IBUPROFEN 600 MG PO TABS
600.0000 mg | ORAL_TABLET | Freq: Four times a day (QID) | ORAL | Status: DC
  Administered 2018-11-17 – 2018-11-18 (×5): 600 mg via ORAL
  Filled 2018-11-17 (×5): qty 1

## 2018-11-17 MED ORDER — DIPHENHYDRAMINE HCL 25 MG PO CAPS: 25.0000 mg | ORAL_CAPSULE | Freq: Four times a day (QID) | ORAL | Status: DC | PRN

## 2018-11-17 MED ORDER — PRENATAL MULTIVITAMIN CH
1.0000 | ORAL_TABLET | Freq: Every day | ORAL | Status: DC
  Administered 2018-11-17: 1 via ORAL
  Filled 2018-11-17 (×2): qty 1

## 2018-11-17 MED ORDER — WITCH HAZEL-GLYCERIN EX PADS: 1.0000 "application " | MEDICATED_PAD | CUTANEOUS | Status: DC | PRN

## 2018-11-17 MED ORDER — TERBUTALINE SULFATE 1 MG/ML IJ SOLN: 0.2500 mg | Freq: Once | INTRAMUSCULAR | Status: DC | PRN

## 2018-11-17 MED ORDER — MISOPROSTOL 25 MCG QUARTER TABLET
25.0000 ug | ORAL_TABLET | ORAL | Status: DC | PRN
  Administered 2018-11-17: 25 ug via VAGINAL
  Filled 2018-11-17: qty 1

## 2018-11-17 NOTE — Anesthesia Procedure Notes (Signed)
Epidural Patient location during procedure: OB Start time: 11/17/2018 6:14 AM End time: 11/17/2018 6:22 AM  Staffing Anesthesiologist: Josephine Igo, MD  Preanesthetic Checklist Completed: patient identified, site marked, surgical consent, pre-op evaluation, timeout performed, IV checked, risks and benefits discussed and monitors and equipment checked  Epidural Patient position: sitting Prep: site prepped and draped and DuraPrep Patient monitoring: continuous pulse ox and blood pressure Approach: midline Location: L3-L4 Injection technique: LOR air  Needle:  Needle type: Tuohy  Needle gauge: 17 G Needle length: 9 cm and 9 Needle insertion depth: 5 cm cm Catheter type: closed end flexible Catheter size: 19 Gauge Catheter at skin depth: 10 cm Test dose: negative and Other  Assessment Events: blood not aspirated, injection not painful, no injection resistance, negative IV test and no paresthesia  Additional Notes Patient identified. Risks and benefits discussed including failed block, incomplete  Pain control, post dural puncture headache, nerve damage, paralysis, blood pressure Changes, nausea, vomiting, reactions to medications-both toxic and allergic and post Partum back pain. All questions were answered. Patient expressed understanding and wished to proceed. Sterile technique was used throughout procedure. Epidural site was Dressed with sterile barrier dressing. No paresthesias, signs of intravascular injection Or signs of intrathecal spread were encountered.  Patient was more comfortable after the epidural was dosed. Please see RN's note for documentation of vital signs and FHR which are stable. Reason for block:procedure for pain

## 2018-11-17 NOTE — H&P (Signed)
Penny Knox is a 33 y.o. female presenting for IOL. Pt is Therapist, sports. OB History    Gravida  3   Para  2   Term  2   Preterm  0   AB  0   Living  2     SAB  0   TAB  0   Ectopic  0   Multiple  0   Live Births  2          Past Medical History:  Diagnosis Date  . Seasonal allergies    Past Surgical History:  Procedure Laterality Date  . EYE SURGERY     Family History: family history includes Cancer in her paternal grandfather; Depression in her brother and paternal grandmother; Diabetes in her maternal uncle; Hypertension in her maternal grandmother. Social History:  reports that she has never smoked. She has never used smokeless tobacco. She reports current alcohol use of about 1.0 - 2.0 standard drinks of alcohol per week. She reports that she does not use drugs.     Maternal Diabetes: No Genetic Screening: Normal Maternal Ultrasounds/Referrals: Normal Fetal Ultrasounds or other Referrals:  None Maternal Substance Abuse:  No Significant Maternal Medications:  None Significant Maternal Lab Results:  None Other Comments:  None  ROS History Dilation: 10 Effacement (%): 100 Station: Plus 3 Exam by:: Dr. Royston Sinner Blood pressure 121/79, pulse 73, temperature 98.4 F (36.9 C), temperature source Axillary, resp. rate 18, height 5\' 8"  (1.727 m), weight 82.7 kg, last menstrual period 02/08/2018, SpO2 99 %. Exam Physical Exam  Prenatal labs: ABO, Rh: --/--/A POS, A POS Performed at [facility], [place] [address], [place], [place] [address]  [phone]/23 0025) Antibody: NEG (06/23 0025) Rubella: Immune (11/12 0000) RPR: Nonreactive (11/12 0000)  HBsAg: Negative (11/12 0000)  HIV: Non-reactive (11/12 0000)  GBS: Negative (05/22 0000)   Assessment/Plan: 33 yo G3P2002 @ 40.2 wga presenting for IOL - elective.  S/p cytotec and SROM. Pitocin started.  Now in active labor, ready to push.  Hx LGA. GBS neg.    Tyson Dense 11/17/2018, 9:26 AM

## 2018-11-17 NOTE — Anesthesia Postprocedure Evaluation (Signed)
Anesthesia Post Note  Patient: Penny Knox  Procedure(s) Performed: AN AD HOC LABOR EPIDURAL     Anesthesia Post Evaluation  Last Vitals:  Vitals:   11/17/18 1130 11/17/18 1554  BP: 120/79 115/87  Pulse: 69 65  Resp: 18 18  Temp: 37 C 36.8 C  SpO2: 98% 96%    Last Pain:  Vitals:   11/17/18 1555  TempSrc:   PainSc: 0-No pain   Pain Goal:                   [employer]

## 2018-11-17 NOTE — Anesthesia Preprocedure Evaluation (Signed)
Anesthesia Evaluation  Patient identified by MRN, date of birth, ID band Patient awake    Reviewed: Allergy & Precautions, Patient's Chart, lab work & pertinent test results  Airway Mallampati: II  TM Distance: >3 FB Neck ROM: Full    Dental no notable dental hx. (+) Teeth Intact   Pulmonary neg pulmonary ROS,    Pulmonary exam normal breath sounds clear to auscultation       Cardiovascular negative cardio ROS Normal cardiovascular exam Rhythm:Regular Rate:Normal     Neuro/Psych negative neurological ROS  negative psych ROS   GI/Hepatic negative GI ROS, Neg liver ROS,   Endo/Other  negative endocrine ROS  Renal/GU negative Renal ROS  negative genitourinary   Musculoskeletal negative musculoskeletal ROS (+)   Abdominal   Peds  Hematology negative hematology ROS (+)   Anesthesia Other Findings   Reproductive/Obstetrics (+) Pregnancy                             Anesthesia Physical Anesthesia Plan  ASA: II  Anesthesia Plan: Epidural   Post-op Pain Management:    Induction:   PONV Risk Score and Plan:   Airway Management Planned: Natural Airway  Additional Equipment:   Intra-op Plan:   Post-operative Plan:   Informed Consent: I have reviewed the patients History and Physical, chart, labs and discussed the procedure including the risks, benefits and alternatives for the proposed anesthesia with the patient or authorized representative who has indicated his/her understanding and acceptance.     Plan Discussed with: Anesthesiologist  Anesthesia Plan Comments:         Anesthesia Quick Evaluation  

## 2018-11-18 LAB — CBC
HCT: 39.7 % (ref 36.0–46.0)
Hemoglobin: 12.8 g/dL (ref 12.0–15.0)
MCH: 27.6 pg (ref 26.0–34.0)
MCHC: 32.2 g/dL (ref 30.0–36.0)
MCV: 85.7 fL (ref 80.0–100.0)
Platelets: 262 10*3/uL (ref 150–400)
RBC: 4.63 MIL/uL (ref 3.87–5.11)
RDW: 13 % (ref 11.5–15.5)
WBC: 10.9 10*3/uL — ABNORMAL HIGH (ref 4.0–10.5)
nRBC: 0 % (ref 0.0–0.2)

## 2018-11-18 MED ORDER — ACETAMINOPHEN 325 MG PO TABS: 650.0000 mg | ORAL_TABLET | Freq: Four times a day (QID) | ORAL | 1 refills | Status: AC | PRN

## 2018-11-18 MED ORDER — IBUPROFEN 600 MG PO TABS: 600.0000 mg | ORAL_TABLET | Freq: Four times a day (QID) | ORAL | 1 refills | Status: AC | PRN

## 2018-11-18 NOTE — Lactation Note (Signed)
This note was copied from a baby's chart. Lactation Consultation Note  Patient Name: Penny Knox [id number] Date: 11/18/2018  P3, 7 hour female infant. LC entered the room mom and infant asleep.   Maternal Data    Feeding    LATCH Score                   Interventions    Lactation Tools Discussed/Used     Consult Status      Vicente Serene 11/18/2018, 4:13 AM

## 2018-11-18 NOTE — Lactation Note (Addendum)
This note was copied from a baby's chart. Lactation Consultation Note  Patient Name: Penny Knox [id number] Date: 11/18/2018 Reason for consult: Initial assessment   P3, Baby 90 hours old. Ex BF for 1 year with both sons. Mother latched baby in cradle hold.  Offered pillows and mother declined.  States she has boppy at home. Mother latched baby easily.  Swallows observed. Feed on demand approximately 8-12 times per day.   Reviewed engorgement care and monitoring voids/stools. No questions or concerns per mother.  Lactation brochure with resources provided.    Maternal Data Has patient been taught Hand Expression?: Yes Does the patient have breastfeeding experience prior to this delivery?: Yes  Feeding Feeding Type: Breast Fed  LATCH Score Latch: Grasps breast easily, tongue down, lips flanged, rhythmical sucking.  Audible Swallowing: A few with stimulation  Type of Nipple: Everted at rest and after stimulation  Comfort (Breast/Nipple): Soft / non-tender  Hold (Positioning): No assistance needed to correctly position infant at breast.  LATCH Score: 9  Interventions Interventions: Breast feeding basics reviewed  Lactation Tools Discussed/Used     Consult Status Consult Status: Complete    Carlye Grippe 11/18/2018, 8:27 AM

## 2018-11-18 NOTE — Discharge Summary (Signed)
Obstetric Discharge Summary Reason for Admission: induction of labor Prenatal Procedures: none Intrapartum Procedures: spontaneous vaginal delivery Postpartum Procedures: none Complications-Operative and Postpartum: none Hemoglobin  Date Value Ref Range Status  11/18/2018 12.8 12.0 - 15.0 g/dL Final   HCT  Date Value Ref Range Status  11/18/2018 39.7 36.0 - 46.0 % Final    Physical Exam:  General: alert, cooperative and no distress Lochia: appropriate Uterine Fundus: firm Incision: healing well DVT Evaluation: No evidence of DVT seen on physical exam.  Discharge Diagnoses: Term Pregnancy-delivered  Discharge Information: Date: 11/18/2018 Activity: pelvic rest Diet: routine Medications: PNV and Ibuprofen Condition: stable Instructions: refer to practice specific booklet Discharge to: home   Newborn Data: Live born female  Birth Weight: 7 lb 13.4 oz (3555 g) APGAR: 7, 9  Newborn Delivery   Birth date/time: 11/17/2018 08:20:00 Delivery type: Vaginal, Spontaneous      Home with mother.  Shon Millet II 11/18/2018, 11:02 AM

## 2019-11-18 IMAGING — US US AXILLARY LEFT
1 series · 3 of 3 positions shown · non-contrast
Comparison: Left axillary ultrasound November 09, 2017

CLINICAL DATA: 32-year-old patient presents for evaluation of
bilateral axillary masses. She is currently 13 weeks pregnant. This
is her third pregnancy. Since the beginning of this pregnancy, she
has noticed that the right breast has enlarged of more than the left
with pregnancy. She has also developed what she feels to be 3
discrete masses in the right axilla, adjacent to each other, aligned
in a somewhat linear fashion.

She states that she has had a more prominent left axilla for years
that was evaluated at our office in 9660 and benign.
She states that in 1 of her earlier pregnancies, she had signs and
symptoms of milk production in the left axilla.
EXAM:
ULTRASOUND OF THE BILATERAL AXILLAE

[Series 1: us axillary left · 0.07mm/px · 3 of 3 slices shown]
[im 1/3]
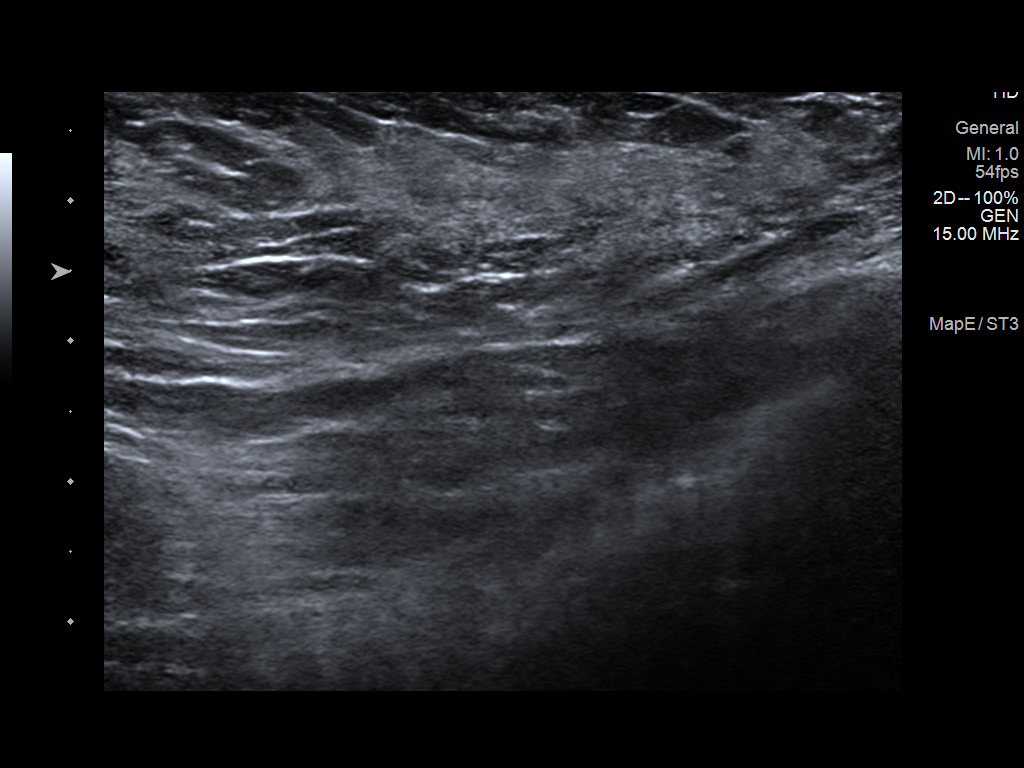
[im 2/3]
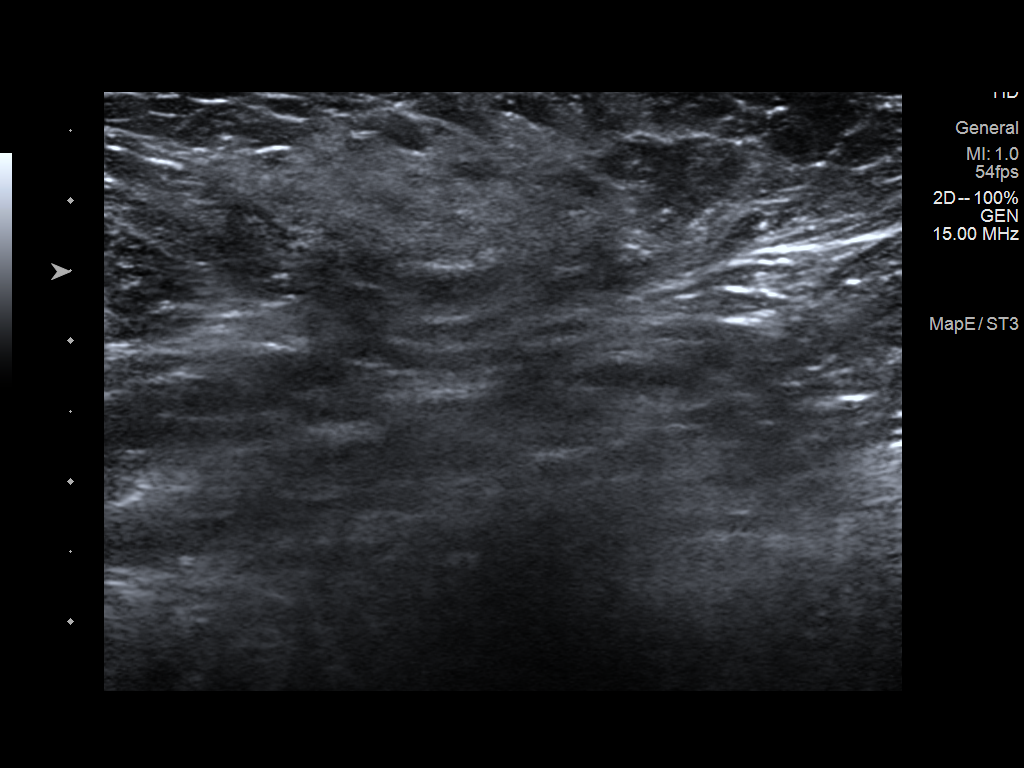
[im 3/3]
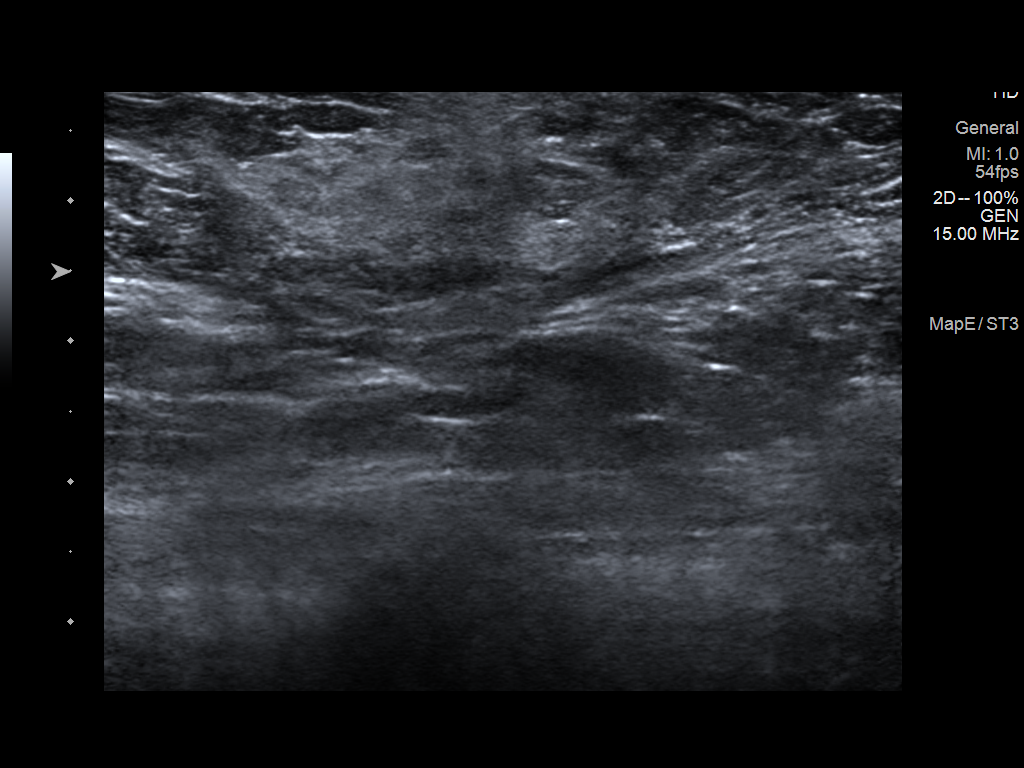

[3 of 3 positions shown; findings below may reference images not displayed]

FINDINGS: On physical exam, the mid right axilla is slightly firm to
palpation, but no discrete lymphadenopathy or suspicious mass is
palpated. The skin appears normal. There is more prominent
subcutaneous fat in the left axilla than the right, without
suspicious mass in the left axilla.

Targeted ultrasound is performed, showing on the right:
Heterogeneous tissue with small tubular fluid-filled ducts in the
immediate subcutaneous right axilla in the region of patient
concern, consistent with axillary breast tissue. Normal right
axillary lymph node is imaged. No suspicious mass is seen.

In the left axilla normal subcutaneous fat and a focal patchy of
axillary breast tissue is seen. No mass or lymphadenopathy.
IMPRESSION: Bilateral axillary breast tissue, more prominent on the right than
left.

Negative for axillary lymphadenopathy.

RECOMMENDATION:
No further imaging follow-up is recommended, unless there are
progressive palpable areas of concern. As the patient seemed
somewhat anxious about the palpable areas in the right axilla today,
we also discussed the option of ultrasound-guided core needle biopsy
for pathologic diagnosis if she desires. At this point, she plans to
discuss findings with her obstetrician gynecologist, and have them
follow her clinically.

I have discussed the findings and recommendations with the patient.
Results were also provided in writing at the conclusion of the
visit. If applicable, a reminder letter will be sent to the patient
regarding the next appointment.

BI-RADS CATEGORY  2: Benign.
